# Patient Record
Sex: Female | Born: 1960 | Race: Black or African American | Hispanic: No | Marital: Married | Smoking: Never smoker
Health system: Southern US, Community
[De-identification: ages and names within clinical notes are randomized; demographics above are authoritative.]

## PROBLEM LIST (undated history)

## (undated) DIAGNOSIS — Z5189 Encounter for other specified aftercare: Secondary | ICD-10-CM

## (undated) DIAGNOSIS — D649 Anemia, unspecified: Secondary | ICD-10-CM

## (undated) DIAGNOSIS — E785 Hyperlipidemia, unspecified: Secondary | ICD-10-CM

## (undated) DIAGNOSIS — I10 Essential (primary) hypertension: Secondary | ICD-10-CM

## (undated) HISTORY — DX: Hyperlipidemia, unspecified: E78.5

## (undated) HISTORY — DX: Anemia, unspecified: D64.9

## (undated) HISTORY — PX: WISDOM TOOTH EXTRACTION: SHX21

## (undated) HISTORY — DX: Essential (primary) hypertension: I10

## (undated) HISTORY — PX: ABDOMINAL HYSTERECTOMY: SHX81

## (undated) HISTORY — DX: Encounter for other specified aftercare: Z51.89

---

## 1998-05-23 ENCOUNTER — Emergency Department (HOSPITAL_COMMUNITY): Admission: EM | Admit: 1998-05-23 | Discharge: 1998-05-23 | Payer: Self-pay | Admitting: Emergency Medicine

## 1999-05-07 ENCOUNTER — Emergency Department (HOSPITAL_COMMUNITY): Admission: EM | Admit: 1999-05-07 | Discharge: 1999-05-07 | Payer: Self-pay | Admitting: *Deleted

## 1999-05-09 ENCOUNTER — Emergency Department (HOSPITAL_COMMUNITY): Admission: EM | Admit: 1999-05-09 | Discharge: 1999-05-09 | Payer: Self-pay

## 2000-04-10 ENCOUNTER — Emergency Department (HOSPITAL_COMMUNITY): Admission: EM | Admit: 2000-04-10 | Discharge: 2000-04-10 | Payer: Self-pay | Admitting: Emergency Medicine

## 2000-06-06 ENCOUNTER — Encounter: Payer: Self-pay | Admitting: Emergency Medicine

## 2000-06-06 ENCOUNTER — Emergency Department (HOSPITAL_COMMUNITY): Admission: EM | Admit: 2000-06-06 | Discharge: 2000-06-06 | Payer: Self-pay | Admitting: Emergency Medicine

## 2004-11-04 ENCOUNTER — Other Ambulatory Visit: Admission: RE | Admit: 2004-11-04 | Discharge: 2004-11-04 | Payer: Self-pay | Admitting: Obstetrics and Gynecology

## 2005-05-26 ENCOUNTER — Emergency Department (HOSPITAL_COMMUNITY): Admission: EM | Admit: 2005-05-26 | Discharge: 2005-05-26 | Payer: Self-pay | Admitting: Emergency Medicine

## 2005-12-20 ENCOUNTER — Encounter: Admission: RE | Admit: 2005-12-20 | Discharge: 2005-12-20 | Payer: Self-pay | Admitting: Obstetrics and Gynecology

## 2006-01-02 ENCOUNTER — Observation Stay (HOSPITAL_COMMUNITY): Admission: AD | Admit: 2006-01-02 | Discharge: 2006-01-03 | Payer: Self-pay | Admitting: Obstetrics and Gynecology

## 2006-01-04 ENCOUNTER — Encounter (INDEPENDENT_AMBULATORY_CARE_PROVIDER_SITE_OTHER): Payer: Self-pay | Admitting: *Deleted

## 2006-01-04 ENCOUNTER — Inpatient Hospital Stay (HOSPITAL_COMMUNITY): Admission: AD | Admit: 2006-01-04 | Discharge: 2006-01-06 | Payer: Self-pay | Admitting: Obstetrics and Gynecology

## 2007-12-02 ENCOUNTER — Emergency Department (HOSPITAL_COMMUNITY): Admission: EM | Admit: 2007-12-02 | Discharge: 2007-12-02 | Payer: Self-pay | Admitting: Family Medicine

## 2007-12-20 ENCOUNTER — Ambulatory Visit: Payer: Self-pay | Admitting: Family Medicine

## 2008-02-28 ENCOUNTER — Ambulatory Visit: Payer: Self-pay | Admitting: Family Medicine

## 2008-03-04 ENCOUNTER — Ambulatory Visit: Payer: Self-pay | Admitting: Family Medicine

## 2008-04-10 ENCOUNTER — Ambulatory Visit: Payer: Self-pay | Admitting: Family Medicine

## 2010-02-17 ENCOUNTER — Emergency Department (HOSPITAL_COMMUNITY): Admission: EM | Admit: 2010-02-17 | Discharge: 2010-02-17 | Payer: Self-pay | Admitting: Emergency Medicine

## 2010-04-08 ENCOUNTER — Ambulatory Visit: Payer: Self-pay | Admitting: Family Medicine

## 2010-05-13 ENCOUNTER — Ambulatory Visit: Payer: Self-pay | Admitting: Family Medicine

## 2010-06-24 ENCOUNTER — Ambulatory Visit: Payer: Self-pay | Admitting: Family Medicine

## 2010-10-01 IMAGING — CR DG HIP COMPLETE 2+V*R*
3 series · 3 of 3 positions shown · non-contrast
Comparison: None

CLINICAL DATA: Pain

RIGHT HIP - COMPLETE 2+ VIEW

[t pelvis a.p.]
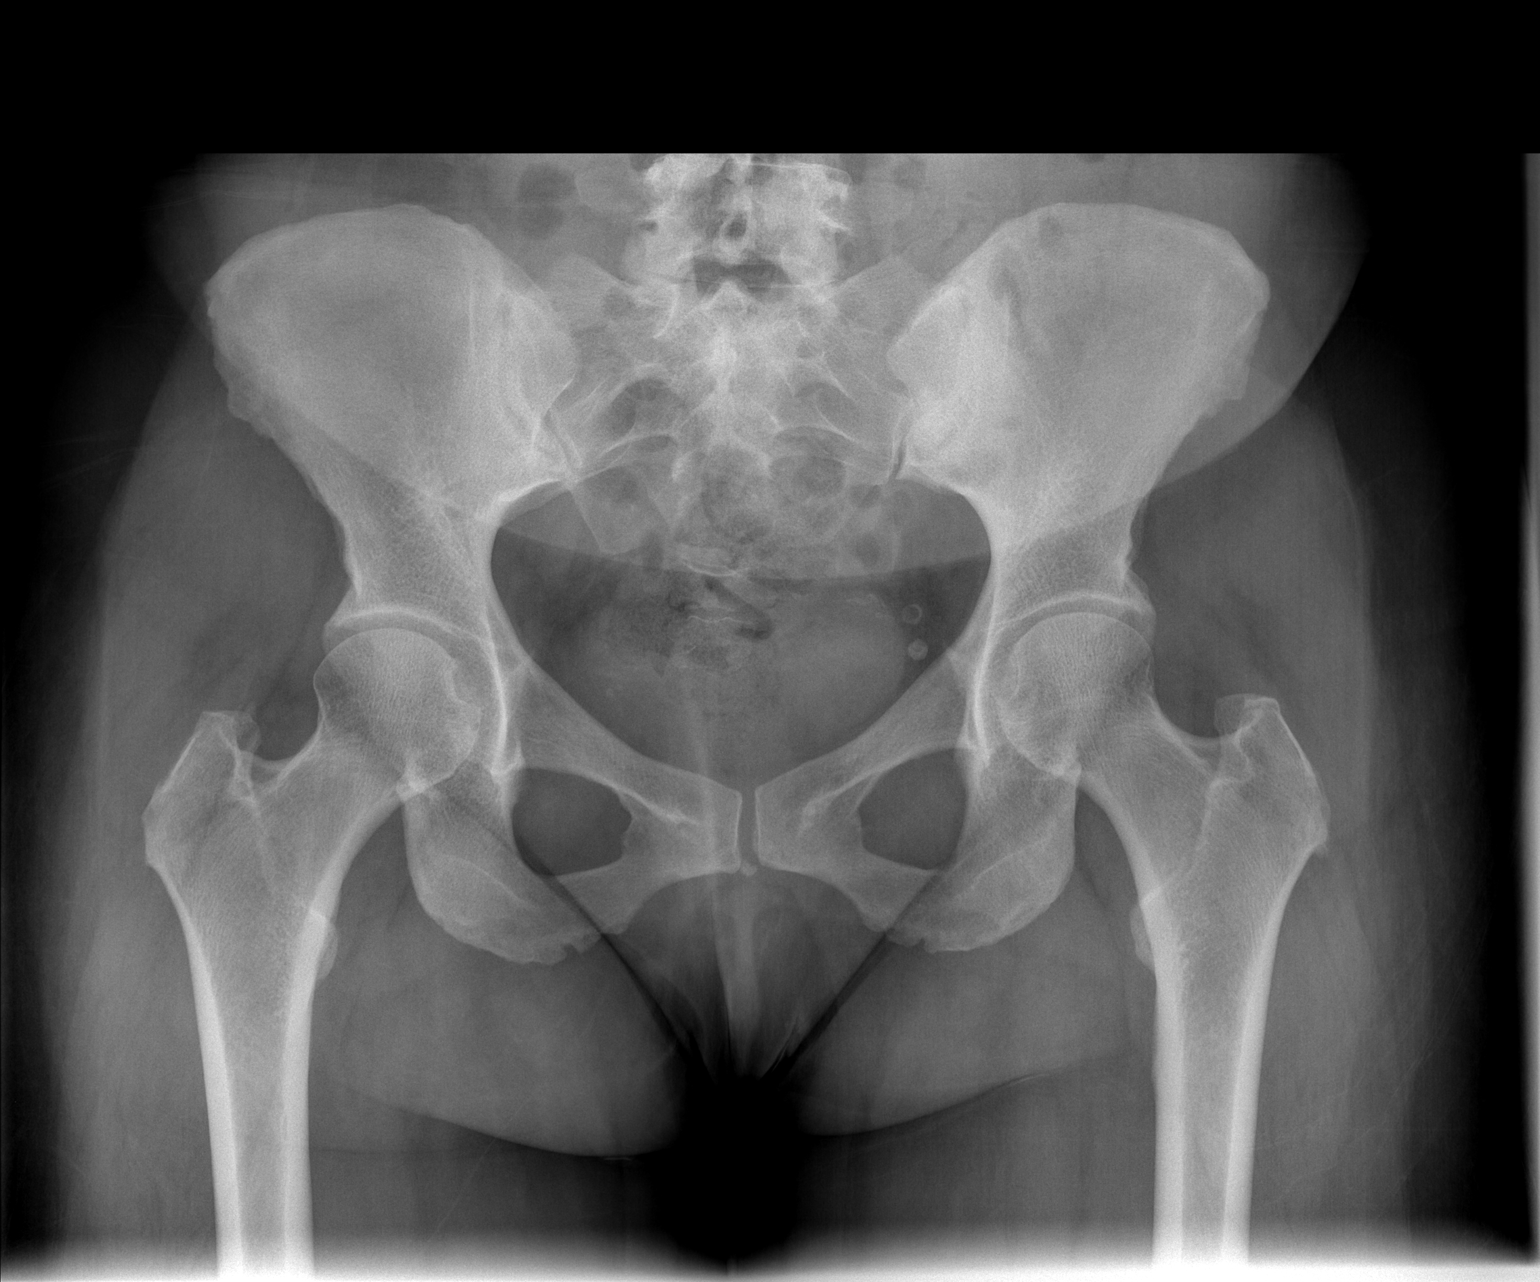

[t hip ap right]
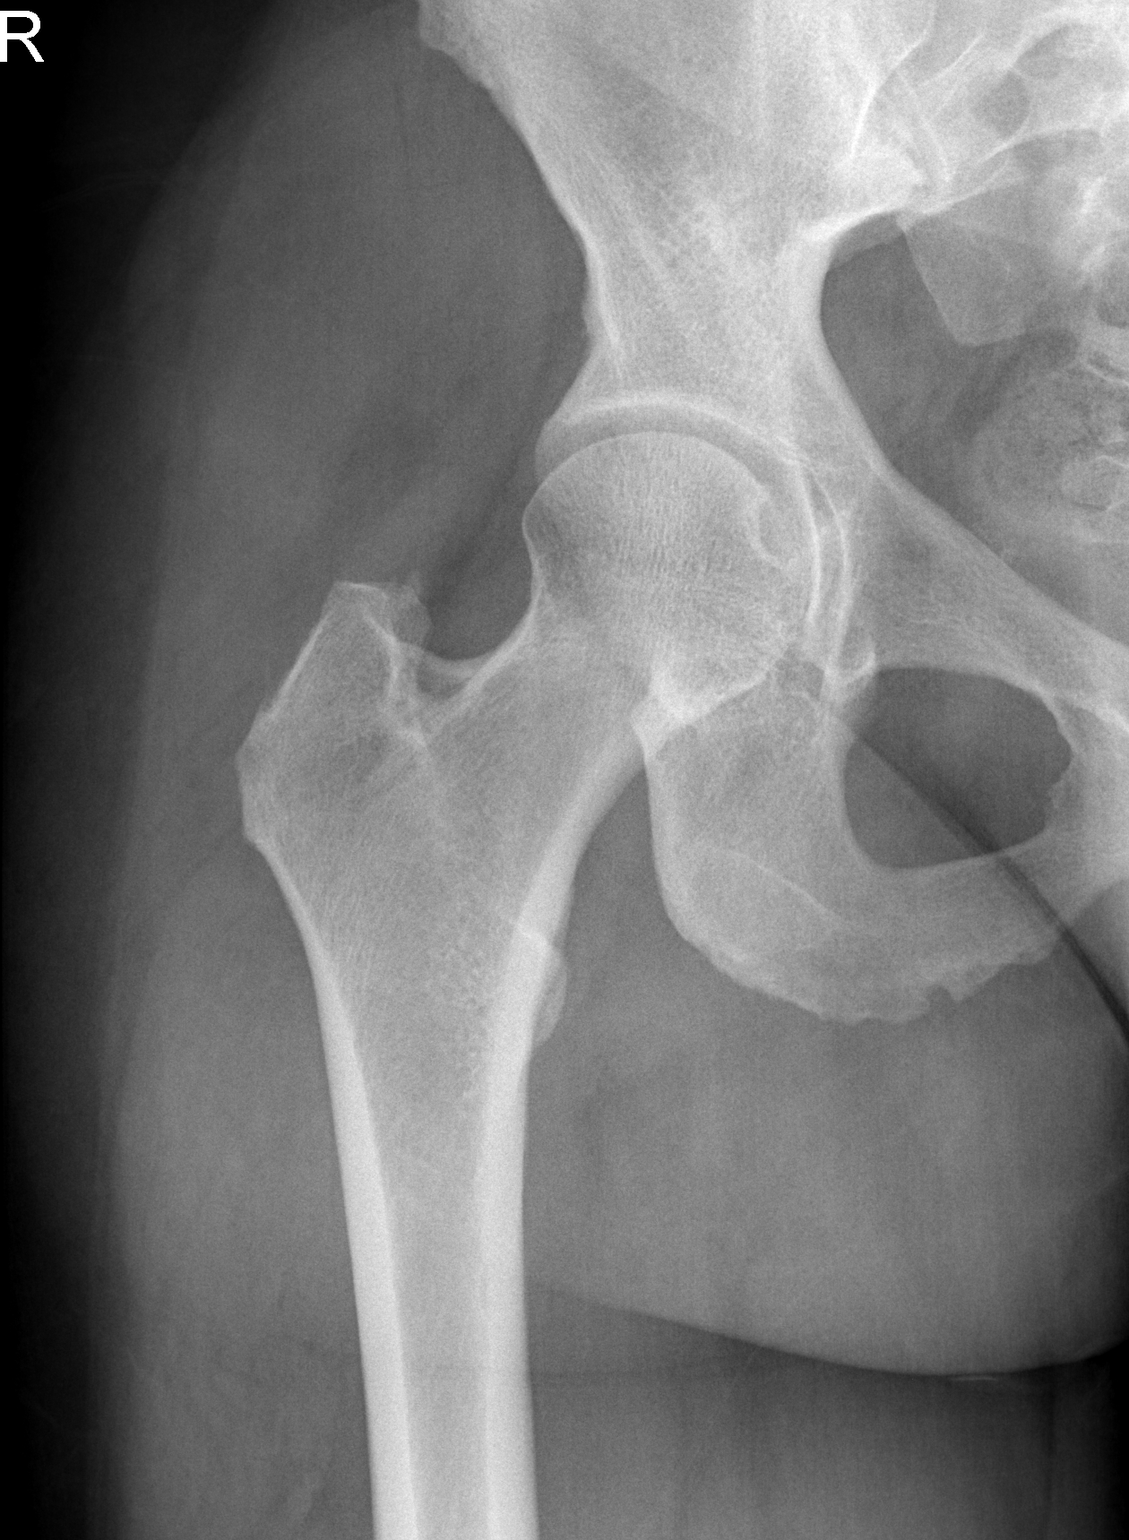

[t hip frog leg right]
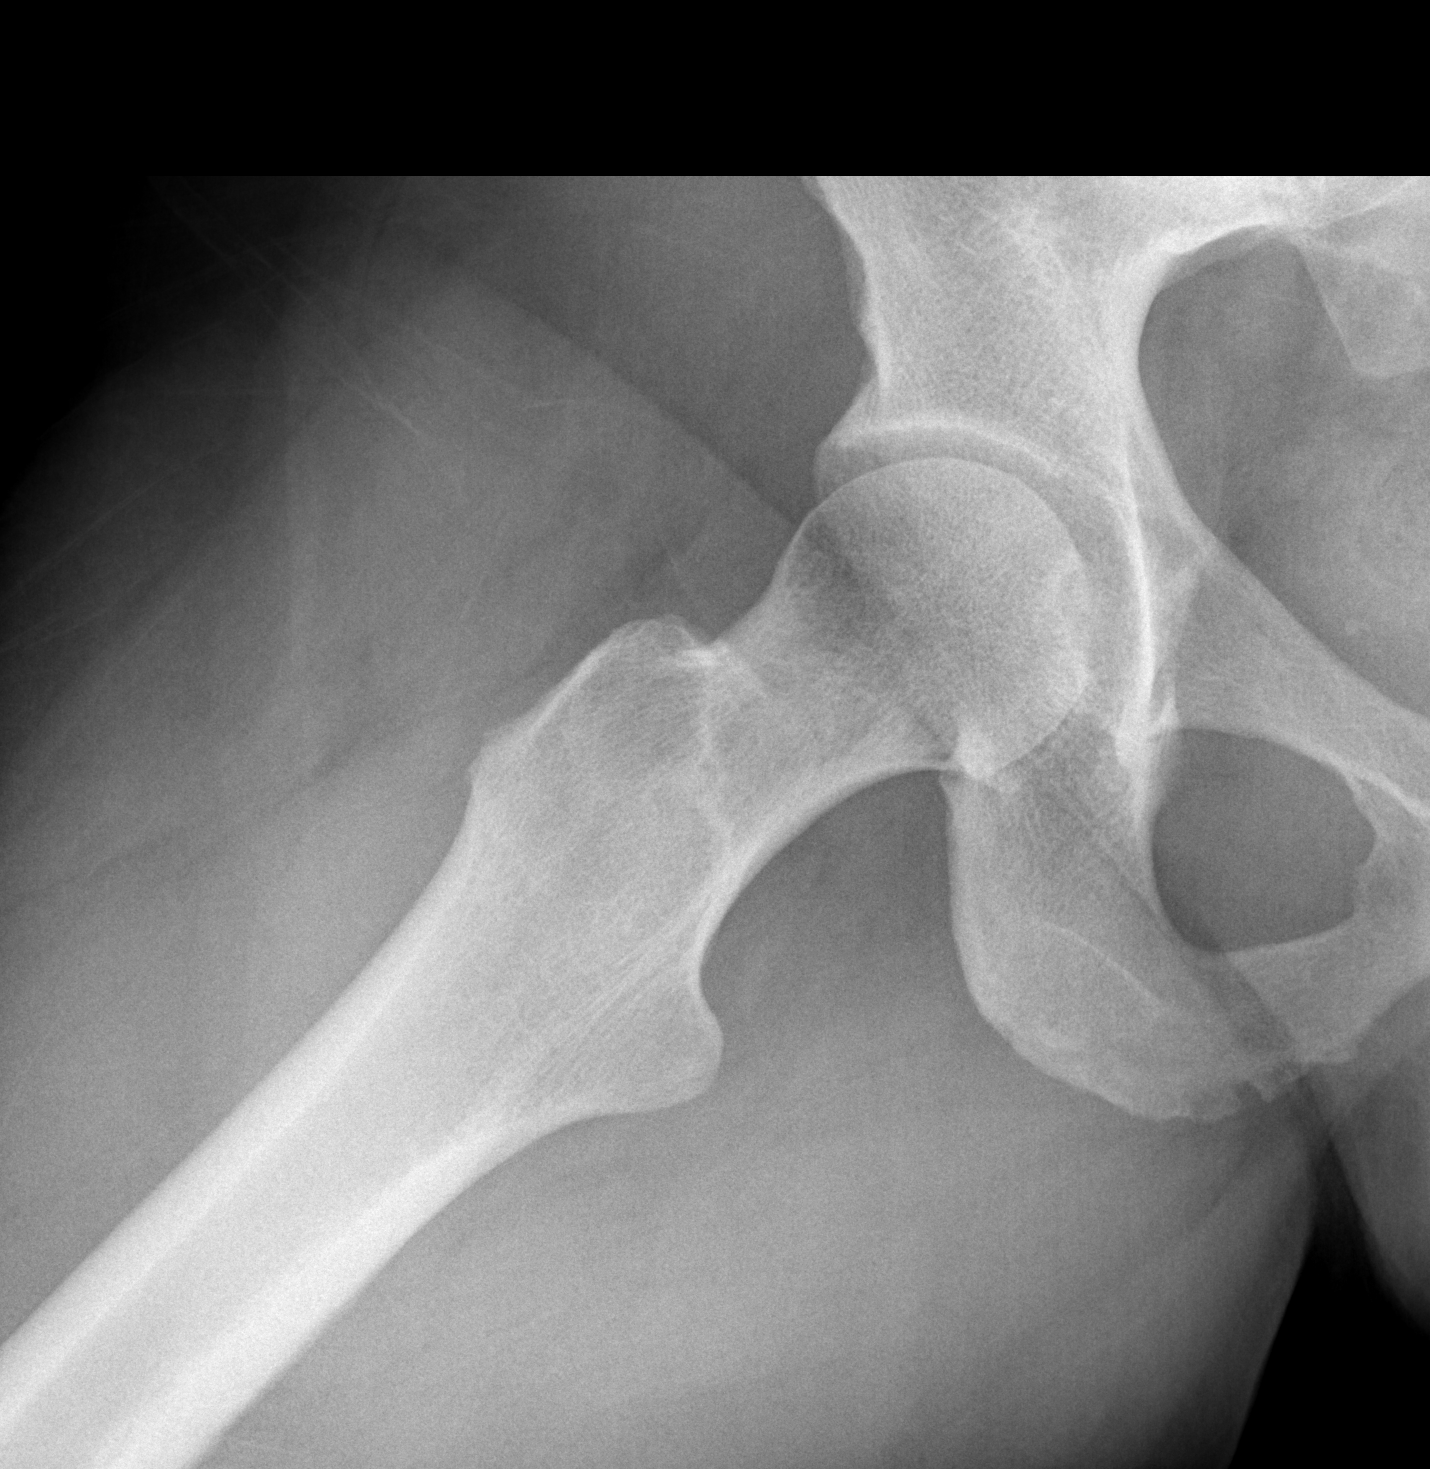

[3 of 3 positions shown; findings below may reference images not displayed]

FINDINGS: There is no evidence of fracture or dislocation.  There
is no evidence of arthropathy or other focal bone abnormality.
Soft tissues are unremarkable.
IMPRESSION: No acute findings.

## 2011-01-03 LAB — POCT I-STAT, CHEM 8
Calcium, Ion: 1.1 mmol/L — ABNORMAL LOW (ref 1.12–1.32)
Chloride: 104 mEq/L (ref 96–112)
Glucose, Bld: 95 mg/dL (ref 70–99)
HCT: 41 % (ref 36.0–46.0)
TCO2: 27 mmol/L (ref 0–100)

## 2011-01-03 LAB — URINE MICROSCOPIC-ADD ON

## 2011-01-03 LAB — URINALYSIS, ROUTINE W REFLEX MICROSCOPIC
Bilirubin Urine: NEGATIVE
Glucose, UA: NEGATIVE mg/dL
Ketones, ur: NEGATIVE mg/dL
Nitrite: POSITIVE — AB
Protein, ur: NEGATIVE mg/dL
Specific Gravity, Urine: 1.03 (ref 1.005–1.030)
Urobilinogen, UA: 1 mg/dL (ref 0.0–1.0)
pH: 6 (ref 5.0–8.0)

## 2011-03-03 NOTE — Op Note (Signed)
Jade Alvarez, Jade Alvarez             ACCOUNT NO.:  [id number]   MEDICAL RECORD NO.:  [id number]          PATIENT TYPE:  INP   LOCATION:  9308                          FACILITY:  [facility]   PHYSICIAN:  Janine Limbo, M.D.DATE OF BIRTH:  Nov 23, 1960   DATE OF PROCEDURE:  01/04/2006  DATE OF DISCHARGE:  01/06/2006                                 OPERATIVE REPORT   PREOPERATIVE DIAGNOSES:  1.  Fibroid uterus.  2.  Menorrhagia.  3.  Severe anemia.   POSTOPERATIVE DIAGNOSES:  1.  Fibroid uterus.  2.  Menorrhagia.  3.  Severe anemia.  4.  Pelvic and abdominal adhesions.   PROCEDURE:  1.  Total abdominal hysterectomy.  2.  Lysis of adhesions.   SURGEON:  Dr. Leonard Schwartz   FIRST ASSISTANT:  Elsie Ra, Certified Nurse Midwife.   ANESTHETIC:  General.   DISPOSITION:  Jade Alvarez is a 50 year old female, para 1-1-1-2, who  presents with menorrhagia and a known history of fibroids.  A hysterectomy  had been recommended previously but was declined.  The patient was admitted  to [facility] on January 02, 1006, after a fainting episode at home.  She was evaluated and was found to have a hemoglobin of 6.7.  The patient  was transfused 3 units of packed red blood cells, and she was also given  Depot Lupron.  Her posttransfusion hemoglobin was 8.3.  She was discharged  to home on January 03, 2006, feeling better.  However, on the morning of January 04, 2006, the patient once again fainted.  She noted that her bleeding had  also increased.  The patient was then evaluated once again at [facility] and her hemoglobin was noted to be 7.3.  The decision was made  this time to proceed with hysterectomy.  The patient was transfused 2 units  of packed red blood cells.  Her posttransfusion hemoglobin was 8.6.  The  patient understands the indications for her surgical procedure and she  accepts the risk of, but not limited to, anesthetic complications, bleeding,  infections, and  possible damage to the surrounding organs.  The patient  elects not to have her ovaries removed.   FINDINGS:  The patient was noted to have a 14-week size multifibroid uterus.  The fallopian tubes and the ovaries appeared normal, except there were  adhesions between the adnexa and the pelvic sidewalls.  There were adhesions  between the bowel and the posterior surface of the uterus.  There were  adhesions between the anterior uterus and the bladder (the patient has had 2  prior cesarean sections).  There was no evidence of endometriosis, however,  in the pelvis.  The appendix and the bowel appeared normal.  Sterile milk  was placed in the bladder, and the bladder was noted to be intact.  The  ureters were identified and were noted to be normal.  There was no evidence  of damage to the ureters.  The patient was also noted to have dense scarring  through the layers of the abdominal wall.   PROCEDURE:  The patient was taken to  the operating room where a general  anesthetic was given.  The patient's abdomen, perineum, and vagina were  prepped with multiple layers of Betadine.  A Foley catheter was placed in  the bladder.  The patient was then sterilely draped.  The lower abdomen was  injected with 20 mL of 0.5% Marcaine with epinephrine.  A low transverse  incision was made through the previous incision.  The incision was extended  through the subcutaneous tissue, the fascia, and the anterior peritoneum.  Dense scarring was noted at each of the layers.  Care was taken not to  damage the bowel as we entered the abdominal cavity.  An abdominal wall  retractor was placed.  The uterus was elevated into our operative field.  Lysis of adhesions was accomplished using a combination of sharp and blunt  dissection.  There was some bleeding noted from the uterine surface, and  hemostasis was achieved using figure-of-eight sutures on the uterus.  We  identified the round ligaments bilaterally.  The  right round ligament was  then clamped, cut, sutured, and tied securely.  An identical procedure was  carried out on the opposite side.  The bladder flap was developed  anteriorly.  The patient was noted to have some bleeding from the posterior  surface of the bladder.  Hemostasis was achieved using free ties.  The right  upper pedicle was then isolated, clamped, cut, free tied, and then suture  ligated.  An identical procedure was carried out on the opposite side.  The  uterine vessels were skeletonized bilaterally.  Alternating from right to  left, the uterine arteries and parametrial tissues were then clamped, cut,  sutured, and tied securely.  We transected the fundus of the uterus from the  cervix to aid in visualization.  We were then better able to develop the  bladder flap.  The paracervical tissues, uterosacral ligaments, and vaginal  angles were then clamped, cut, sutured, and tied securely.  The cervix was  transected from the apex of the vagina.  The apex of the vagina was closed  using figure-of-eight sutures.  We again noted bleeding from the bladder  flap, and figure-of-eight sutures were placed at this time, taking care not  to enter the mucosa of the bladder.  At this point, hemostasis was noted to  be adequate.  The pelvis was vigorously irrigated.  The sutures attached to  the uterosacral ligaments were then tied to the vaginal angles.  The  estimated blood loss at this point was noted to be 600 mL, and therefore the  patient was transfused an additional unit of packed red blood cells.  A  final check was made for hemostasis, and hemostasis was confirmed  throughout.  The packs were removed.  The anterior peritoneum was closed  using a running suture of 3-0 Vicryl.  The abdominal musculature was  reapproximated in the midline.  The abdominal musculature, fascia, and the  subcutaneous layer were irrigated.  The fascia was closed using 2 running sutures of 0 PDS from the  corners to the midline.  The subcutaneous tissue  was undermined using the Bovie cautery to aid in closing the incision.  Dense scarring was again noted here.  After the scar tissue was released, we  closed the subcutaneous layer using a running suture of 0 Vicryl.  The skin  was reapproximated using a subcuticular suture of 3-0 Monocryl.  Sponge,  needle, and instrument counts were correct on 2 occasions.  The estimated  blood loss for the procedure was 600 mL.  The patient tolerated her  procedure well.  The patient's urine was noted to be clear except for a  cloudiness from the sterile milk that was placed.  The patient was given 30  mg of Toradol IV and 30 mg of Toradol IM during the operative procedure.  Zero Vicryl was the suture material used except where otherwise mentioned.  The patient was awakened from her anesthetic without difficulty and taken to  the recovery room in stable condition.  She tolerated her procedure well.  The uterus was noted to weigh greater than 600 grams, and it was sent to  pathology.  Adhesions were sent to pathology for evaluation as well.      Janine Limbo, M.D.  Electronically Signed     AVS/MEDQ  D:  01/04/2006  T:  01/06/2006  Job:  [id number]

## 2011-03-03 NOTE — H&P (Signed)
NAME:  Jade Alvarez, DUPLECHAIN NO.:  [id number]   MEDICAL RECORD NO.:  [id number]          PATIENT TYPE:  MAT   LOCATION:  MATC                          FACILITY:  [facility]   PHYSICIAN:  Osborn Coho, M.D.   DATE OF BIRTH:  18-Sep-1961   DATE OF ADMISSION:  01/02/2006  DATE OF DISCHARGE:                                HISTORY & PHYSICAL   HISTORY OF PRESENT ILLNESS:  Ms. Jade Alvarez is a 50 year old GYN patient.  She  is a gravida 3, para 1-1-1-2, with a history of 2 live births, C-sections  x2, for failure to progress. She presents today with a history of fibroid  tumors, irregular cycles with very heavy bleeding. She has been followed  since August of 2006 when she was diagnosed by Dr. Marline Backbone.  She is  currently taking iron supplements and multivitamins.  Her cycles have been  irregular and she reports 2 cycles in February.  Her current cycle started  on Monday, January 01, 2006, with very heavy bleeding and large clots. The  patient reports soaking greater than 1 pad per hour, and she did pass out at  home yesterday.  Her bleeding today was worse than it was yesterday.  Her  bleeding was heavier with more clots. She did pass out at home and woke up  on the floor.  She called into the office of [facility] and was instructed to come  to maternity admissions at [facility] for evaluation. The patient is  symptomatic with her anemia and passed out when standing for orthostatic  vital signs.  She is therefore to be admitted for blood transfusion.   MEDICAL HISTORY:  History of fibroids and anemia.  Otherwise, no medical  conditions.   ALLERGIES:  The patient has a PENICILLIN allergy.   SOCIAL HISTORY:  She denies the use of tobacco, alcohol or illicit drugs.   OBSTETRIC HISTORY:  C-section x2 for 2 living children and a first trimester  SAB with no complications.   FAMILY HISTORY:  Mother has a history of anemia.  Maternal grandmother with  breast cancer.  Paternal  grandfather and grandmother with heart disease and  diabetes.   SOCIAL HISTORY:  Jade Alvarez is a married African-American female. 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  Her husband, Jade Alvarez, is involved and supportive. He works at [employer].   PHYSICAL EXAMINATION:  VITAL SIGNS:  The patient is afebrile. Temperature  97.5, pulse 97 lying, and blood pressure 87/60 lying, pulse 98 sitting and  blood pressure 94/53 sitting, pulse 106 standing, blood pressure 71/42  standing. The patient did pass out when standing for blood pressure. Her  respirations are 20.  HEENT:  Unremarkable. HEART: Regular rate and rhythm.  LUNGS:  Clear. ABDOMEN:  Soft and nontender.  Negative CVA tenderness  bilaterally. EXTREMITIES:  No pathologic edema.  There is no calf tenderness  bilaterally.  PELVIC:  Normal external genitalia. Vagina with moderate blood  in the vault. No active bleeding at present.  Her cervix is long and closed  with no cervical motion tenderness. Uterus  fibroids were noted 16 weeks'  size, mobile, nontender. Adnexa no masses.   LABORATORY DATA:  CBC revealed a WBC of 10.9, RBC 2.53, hemoglobin 6.7,  hematocrit 22.2, platelets 216,000.   ASSESSMENT:  Dysfunctional uterine bleeding with uterine fibroids,  symptomatic anemia.   PLAN:  Admit per Dr. Osborn Coho.  The patient is to receive blood  transfusion to hematocrit of 20. This was discussed, and the patient has  consented.  See orders as written.      Rica Koyanagi, C.N.M.      Osborn Coho, M.D.  Electronically Signed    SDM/MEDQ  D:  01/02/2006  T:  01/03/2006  Job:  [id number]

## 2011-03-03 NOTE — Discharge Summary (Signed)
Jade Alvarez, Jade Alvarez             ACCOUNT NO.:  [id number]   MEDICAL RECORD NO.:  [id number]          PATIENT TYPE:  INP   LOCATION:  9308                          [facility]ACILITY:  [facility]   PHYSICIAN:  Janine Limbo, M.D.DATE OF BIRTH:  [birth year]   DATE OF ADMISSION:  01/04/2006  DATE OF DISCHARGE:  01/06/2006                                 DISCHARGE SUMMARY   DISCHARGE DIAGNOSES:  1.  Symptomatic uterine fibroids.  2.  Adenomyosis.  3.  Menorrhagia.  4.  Pelvic adhesions.  5.  Severe anemia (hemoglobin as low as 6.7).   OPERATION ON THE DATE OF ADMISSION:  The patient underwent a total abdominal  hysterectomy with lysis of adhesions, tolerating the procedure well.   HISTORY OF PRESENT ILLNESS:  Jade Alvarez is a 50 year old female para 1-1-1-  2 who presents with a history of severe anemia secondary to menorrhagia (had  been transfused 3 units of packed red blood cells 3 days prior to  admission).  The patient on the day of admission was noted to have a  hemoglobin of 7.3, and, in spite of having been given Lupron, had resumed  heavy bleeding and was admitted for further management.  Please see the  patient's dictated history and physical examination for details.   ADMISSION PHYSICAL EXAMINATION:  VITAL SIGNS:  The patient was afebrile and  vital signs were stable on admission.  GENERAL:  Within normal limits.  PELVIC:  An 18-week size fibroid uterus.   HOSPITAL COURSE:  On the day of admission, the patient was transfused 2  units of packed red blood cells which brought her hemoglobin to 7.3.  After  this, the patient consented to proceed with a total abdominal hysterectomy  for definitive treatment of her symptoms.  Hospital course was unremarkable  with the patient achieving a post transfusion hemoglobin of 8.9 and  tolerating a postoperative hemoglobin of 6.9.  By postoperative day #2, the  patient had resumed bowel and bladder function, was stable and not  orthostatic,  and was therefore deemed ready for discharge home.   DISCHARGE MEDICATIONS:  1.  Vicodin 1-2 tablets every 4 hours as needed for pain.  2.  Ibuprofen 200 mg 3 tablets every 6 hours as needed for mild to moderate      pain.  3.  Iron 325 mg 1 tablet three times daily for 6 weeks.  4.  One-a-Day vitamin 1 tablet daily.   FOLLOW UP:  The patient was asked to call for a 6 weeks postoperative visit  with Dr. Stefano Gaul at [phone].   DISCHARGE INSTRUCTIONS:  The patient was given a copy of [facility] postoperative instruction sheet.  She was further advised to avoid  driving for 2 weeks, heavy lifting for 4 weeks, intercourse for 6 weeks, to  increase her activities slowly, that she may shower, that she may walk up  steps.   DIET:  The patient's diet was without restriction.   FINAL PATHOLOGY:  1.  Uterus:  Endometrium revealed proliferative endometrium without      hyperplasia or malignancy identified; myometrium revealed adenomyosis  with multiple leiomyomas.  2.  Pelvic adhesions:  Dense fibrous tissue consistent with adhesions.      Elmira J. Adline Peals.      Janine Limbo, M.D.  Electronically Signed    EJP/MEDQ  D:  01/25/2006  T:  01/25/2006  Job:  [id number]

## 2011-03-03 NOTE — H&P (Signed)
NAMEMARKEETA, Jade Alvarez             ACCOUNT NO.:  [id number]   MEDICAL RECORD NO.:  [id number]          PATIENT TYPE:  INP   LOCATION:  9308                          [facility]   PHYSICIAN:  Janine Limbo, M.D.DATE OF BIRTH:  1961/07/29   DATE OF ADMISSION:  01/04/2006  DATE OF DISCHARGE:                                HISTORY & PHYSICAL   HISTORY OF PRESENT ILLNESS:  Jade Alvarez is a 50 year old female, para 1-1-  1-2, who is admitted because of menorrhagia and severe anemia. The patient  has a known history of fibroids. The patient was at to the hospital on January 02, 2006 after having a syncopal episode. Her hemoglobin was noted to be  6.7. The patient was transfused 3 units of packed red blood cells. She did  feel better. Her post-transfusion hemoglobin was 8.3. The patient was  discharged to home on January 03, 2006. The patient was also given Depo-Lupron  and was encouraged to consider surgical management. However on January 04, 2006 the patient again had a syncopal episode and returned after having even  more vaginal bleeding. The patient has had two cesarean sections in the  past.   OBSTETRICAL HISTORY:  The patient has had two cesarean sections in the past  as mentioned above, and she has also had one first trimester miscarriage.   PAST MEDICAL HISTORY:  The patient denies hypertension and diabetes.   CURRENT MEDICATIONS:  Include iron and vitamins.   ALLERGIES:  PENICILLIN.   SOCIAL HISTORY:  The patient denies alcohol use, tobacco use, and  recreational drug use.   REVIEW OF SYSTEMS:  Please see history of present illness.   FAMILY HISTORY:  Noncontributory.   PHYSICAL EXAMINATION:  The patient was afebrile and vital signs were stable  on admission.  HEENT:  Within normal limits.  CHEST:  Clear.  HEART:  Regular rate and rhythm.  BREASTS:  Without masses.  ABDOMEN:  Soft and a firm mass could be palpated in the pelvis.  EXTREMITIES:  Grossly normal.  NEUROLOGIC EXAM:  Grossly normal.  PELVIC EXAM:  Showed an 18-week sized fibroid uterus.   LABORATORY VALUES:  Hemoglobin on admission today was 7.3. The patient had a  benign endometrial biopsy in 2006. Her Pap smear was within normal limits in  2006.   ASSESSMENT:  1.  An 18-week sized fibroid uterus.  2.  Severe anemia.  3.  Menorrhagia.   PLAN:  We will transfuse the patient two additional units of packed red  blood cells. I have recommended that we proceed with abdominal hysterectomy  and the patient agrees. The patient understands the indications for her  surgical procedure and she accepts the risk of, but not limited to,  anesthetic complications, bleeding, infections, and possible damage to the  surrounding organs.      Janine Limbo, M.D.  Electronically Signed     AVS/MEDQ  D:  01/04/2006  T:  01/05/2006  Job:  [id number]

## 2011-07-07 LAB — I-STAT 8, (EC8 V) (CONVERTED LAB)
BUN: 14
Bicarbonate: 26.4 — ABNORMAL HIGH
Glucose, Bld: 89
Potassium: 4
Sodium: 136
TCO2: 28
pH, Ven: 7.37 — ABNORMAL HIGH

## 2011-07-07 LAB — POCT I-STAT CREATININE: Operator id: 235561

## 2011-12-06 ENCOUNTER — Ambulatory Visit (INDEPENDENT_AMBULATORY_CARE_PROVIDER_SITE_OTHER): Payer: Self-pay | Admitting: Family Medicine

## 2011-12-06 VITALS — BP 190/120 | HR 78

## 2011-12-06 DIAGNOSIS — R040 Epistaxis: Secondary | ICD-10-CM

## 2011-12-06 DIAGNOSIS — I10 Essential (primary) hypertension: Secondary | ICD-10-CM

## 2011-12-06 MED ORDER — LISINOPRIL-HYDROCHLOROTHIAZIDE 20-12.5 MG PO TABS: 1.0000 | ORAL_TABLET | Freq: Every day | ORAL | Status: DC

## 2011-12-06 MED ORDER — CARVEDILOL 6.25 MG PO TABS: 6.2500 mg | ORAL_TABLET | Freq: Two times a day (BID) | ORAL | Status: DC

## 2011-12-06 NOTE — Progress Notes (Signed)
  Subjective:    Patient ID: Jade Alvarez, female    DOB: Sep 28, 1961, 51 y.o.   MRN: [id number]  HPI She developed some bleeding from the right nostril earlier today. She has been off her blood pressure medications for approximately 2 months.   Review of Systems     Objective:   Physical Exam Active bleeding noted from the right nostril.       Assessment & Plan:   1. Epistaxis  Ambulatory referral to ENT  2. Hypertension     the nose was cleaned and packed with gauze impregnated with oxymetazoline. The anterior nasal septum appeared clear. Blood seems to be coming from the posterior part of the nose. She will be referred to ENT. I'll also renew her blood pressure medication and recheck here in one month.

## 2011-12-29 ENCOUNTER — Encounter: Payer: Self-pay | Admitting: Internal Medicine

## 2012-01-03 ENCOUNTER — Ambulatory Visit (INDEPENDENT_AMBULATORY_CARE_PROVIDER_SITE_OTHER): Payer: Self-pay | Admitting: Family Medicine

## 2012-01-03 ENCOUNTER — Encounter: Payer: Self-pay | Admitting: Family Medicine

## 2012-01-03 VITALS — BP 142/90 | HR 68 | Wt 150.0 lb

## 2012-01-03 DIAGNOSIS — I152 Hypertension secondary to endocrine disorders: Secondary | ICD-10-CM | POA: Insufficient documentation

## 2012-01-03 DIAGNOSIS — I10 Essential (primary) hypertension: Secondary | ICD-10-CM

## 2012-01-03 MED ORDER — CARVEDILOL 12.5 MG PO TABS: 12.5000 mg | ORAL_TABLET | Freq: Two times a day (BID) | ORAL | Status: DC

## 2012-01-03 NOTE — Patient Instructions (Signed)
Take 2 pills twice a day of your present prescription and then one a twice a day of the new one and I will see you in one month

## 2012-01-03 NOTE — Progress Notes (Signed)
  Subjective:    Patient ID: Jade Alvarez, female    DOB: [birth year], 51 y.o.   MRN: [id number]  HPI She is here for a blood pressure recheck. Since her last visit, the nosebleeds have been taken care of. She continues on medications listed in the chart and is having no difficulty with them.   Review of Systems     Objective:   Physical Exam Alert and in no distress. Blood pressure is recorded. I rechecked it and it was 150/95       Assessment & Plan:   1. Hypertension    will increase her Coreg to 12.5 twice a day and recheck her in one  month

## 2012-02-02 ENCOUNTER — Ambulatory Visit (INDEPENDENT_AMBULATORY_CARE_PROVIDER_SITE_OTHER): Payer: Self-pay | Admitting: Family Medicine

## 2012-02-02 ENCOUNTER — Encounter: Payer: Self-pay | Admitting: Family Medicine

## 2012-02-02 VITALS — BP 145/84 | HR 60 | Wt 153.0 lb

## 2012-02-02 DIAGNOSIS — Z79899 Other long term (current) drug therapy: Secondary | ICD-10-CM

## 2012-02-02 DIAGNOSIS — I10 Essential (primary) hypertension: Secondary | ICD-10-CM

## 2012-02-02 NOTE — Progress Notes (Signed)
  Subjective:    Patient ID: Jade Alvarez, female    DOB: 1960-12-31, 51 y.o.   MRN: [id number]  HPI He is here for recheck. She continues on her blood pressure medications and is having no difficulty with them.   Review of Systems     Objective:   Physical Exam Alert and in no distress. Blood pressure is recorded      Assessment & Plan:   1. Hypertension   2. Encounter for long-term (current) use of other medications    she will continue present medications. Strongly encouraged her to get involved in a regular exercise program.

## 2012-12-17 ENCOUNTER — Telehealth: Payer: Self-pay | Admitting: Family Medicine

## 2012-12-17 DIAGNOSIS — I10 Essential (primary) hypertension: Secondary | ICD-10-CM

## 2012-12-17 MED ORDER — CARVEDILOL 12.5 MG PO TABS: 12.5000 mg | ORAL_TABLET | Freq: Two times a day (BID) | ORAL | Status: DC

## 2012-12-17 MED ORDER — LISINOPRIL-HYDROCHLOROTHIAZIDE 20-12.5 MG PO TABS: 1.0000 | ORAL_TABLET | Freq: Every day | ORAL | Status: DC

## 2012-12-17 NOTE — Telephone Encounter (Signed)
Sent med in 

## 2012-12-24 ENCOUNTER — Ambulatory Visit (INDEPENDENT_AMBULATORY_CARE_PROVIDER_SITE_OTHER): Payer: Self-pay | Admitting: Family Medicine

## 2012-12-24 ENCOUNTER — Encounter: Payer: Self-pay | Admitting: Family Medicine

## 2012-12-24 VITALS — BP 130/80 | HR 78 | Ht 62.0 in | Wt 162.0 lb

## 2012-12-24 DIAGNOSIS — Z79899 Other long term (current) drug therapy: Secondary | ICD-10-CM

## 2012-12-24 DIAGNOSIS — I10 Essential (primary) hypertension: Secondary | ICD-10-CM

## 2012-12-24 LAB — CBC WITH DIFFERENTIAL/PLATELET
Eosinophils Relative: 2 % (ref 0–5)
HCT: 37.9 % (ref 36.0–46.0)
Hemoglobin: 12.8 g/dL (ref 12.0–15.0)
Lymphocytes Relative: 31 % (ref 12–46)
Lymphs Abs: 2 10*3/uL (ref 0.7–4.0)
MCV: 88.3 fL (ref 78.0–100.0)
Monocytes Absolute: 0.6 10*3/uL (ref 0.1–1.0)
Monocytes Relative: 9 % (ref 3–12)
Neutro Abs: 3.8 10*3/uL (ref 1.7–7.7)
WBC: 6.5 10*3/uL (ref 4.0–10.5)

## 2012-12-24 MED ORDER — CARVEDILOL 12.5 MG PO TABS: 12.5000 mg | ORAL_TABLET | Freq: Two times a day (BID) | ORAL | Status: DC

## 2012-12-24 MED ORDER — LISINOPRIL-HYDROCHLOROTHIAZIDE 20-12.5 MG PO TABS: 1.0000 | ORAL_TABLET | Freq: Every day | ORAL | Status: DC

## 2012-12-24 NOTE — Progress Notes (Signed)
  Subjective:    Patient ID: Jade Alvarez, female    DOB: July 05, 1961, 52 y.o.   MRN: [id number]  HPI She is here for followup visit. She does not have insurance but is working to get it. She continues on her blood pressure medications and is having no difficulty with them. She offers no other concerns or complaints.   Review of Systems     Objective:   Physical Exam alert and in no distress. Tympanic membranes and canals are normal. Throat is clear. Tonsils are normal. Neck is supple without adenopathy or thyromegaly. Cardiac exam shows a regular sinus rhythm without murmurs or gallops. Lungs are clear to auscultation.        Assessment & Plan:  Hypertension - Plan: CBC with Differential, Comprehensive metabolic panel, carvedilol (COREG) 12.5 MG tablet, lisinopril-hydrochlorothiazide (PRINZIDE,ZESTORETIC) 20-12.5 MG per tablet  Encounter for long-term (current) use of other medications - Plan: CBC with Differential, RPR, Comprehensive metabolic panel, TSH, Lipid panel her medications were renewed. Recommend she come in for complete exam sometime in the near future.

## 2012-12-25 LAB — TSH: TSH: 1.409 u[IU]/mL (ref 0.350–4.500)

## 2012-12-25 LAB — COMPREHENSIVE METABOLIC PANEL
Albumin: 4.1 g/dL (ref 3.5–5.2)
Alkaline Phosphatase: 47 U/L (ref 39–117)
BUN: 11 mg/dL (ref 6–23)
CO2: 28 mEq/L (ref 19–32)
Calcium: 9.3 mg/dL (ref 8.4–10.5)
Chloride: 101 mEq/L (ref 96–112)
Glucose, Bld: 79 mg/dL (ref 70–99)
Potassium: 3.7 mEq/L (ref 3.5–5.3)
Sodium: 137 mEq/L (ref 135–145)
Total Protein: 6.9 g/dL (ref 6.0–8.3)

## 2012-12-25 LAB — LIPID PANEL
Cholesterol: 209 mg/dL — ABNORMAL HIGH (ref 0–200)
VLDL: 29 mg/dL (ref 0–40)

## 2014-02-02 ENCOUNTER — Telehealth: Payer: Self-pay | Admitting: Family Medicine

## 2014-02-02 ENCOUNTER — Other Ambulatory Visit: Payer: Self-pay

## 2014-02-02 DIAGNOSIS — I10 Essential (primary) hypertension: Secondary | ICD-10-CM

## 2014-02-02 MED ORDER — CARVEDILOL 12.5 MG PO TABS: 12.5000 mg | ORAL_TABLET | Freq: Two times a day (BID) | ORAL | Status: DC

## 2014-02-02 NOTE — Telephone Encounter (Signed)
[facility] pharmacy requesting rf on carvedilol 12.5mg . Patient has no upcoming appt.

## 2014-02-02 NOTE — Telephone Encounter (Signed)
Medication sent in. 

## 2014-02-12 ENCOUNTER — Telehealth: Payer: Self-pay

## 2014-02-12 NOTE — Telephone Encounter (Signed)
Refill request for LISINOP-HCTZ 20-15.5 #30

## 2014-02-12 NOTE — Telephone Encounter (Signed)
Called pt to let her know she needs an appt before i can send in a refill for a 30 day supply

## 2019-09-01 ENCOUNTER — Other Ambulatory Visit: Payer: Self-pay

## 2019-09-01 ENCOUNTER — Emergency Department (HOSPITAL_COMMUNITY): Payer: 59

## 2019-09-01 ENCOUNTER — Observation Stay (HOSPITAL_COMMUNITY)
Admission: EM | Admit: 2019-09-01 | Discharge: 2019-09-03 | Disposition: A | Discharge status: Home / Self Care | Payer: 59 | Attending: Internal Medicine | Admitting: Internal Medicine

## 2019-09-01 DIAGNOSIS — Z20828 Contact with and (suspected) exposure to other viral communicable diseases: Secondary | ICD-10-CM | POA: Diagnosis not present

## 2019-09-01 DIAGNOSIS — I10 Essential (primary) hypertension: Secondary | ICD-10-CM | POA: Diagnosis not present

## 2019-09-01 DIAGNOSIS — H1031 Unspecified acute conjunctivitis, right eye: Secondary | ICD-10-CM | POA: Diagnosis not present

## 2019-09-01 DIAGNOSIS — Z79899 Other long term (current) drug therapy: Secondary | ICD-10-CM | POA: Insufficient documentation

## 2019-09-01 DIAGNOSIS — Z880 Allergy status to penicillin: Secondary | ICD-10-CM | POA: Insufficient documentation

## 2019-09-01 DIAGNOSIS — I160 Hypertensive urgency: Secondary | ICD-10-CM

## 2019-09-01 DIAGNOSIS — I169 Hypertensive crisis, unspecified: Principal | ICD-10-CM | POA: Diagnosis present

## 2019-09-01 LAB — CBC
HCT: 42.7 % (ref 36.0–46.0)
Hemoglobin: 13.5 g/dL (ref 12.0–15.0)
MCH: 29.2 pg (ref 26.0–34.0)
MCHC: 31.6 g/dL (ref 30.0–36.0)
MCV: 92.2 fL (ref 80.0–100.0)
Platelets: 328 10*3/uL (ref 150–400)
RBC: 4.63 MIL/uL (ref 3.87–5.11)
RDW: 13.3 % (ref 11.5–15.5)
WBC: 8.5 10*3/uL (ref 4.0–10.5)
nRBC: 0 % (ref 0.0–0.2)

## 2019-09-01 LAB — BASIC METABOLIC PANEL
Anion gap: 13 (ref 5–15)
BUN: 11 mg/dL (ref 6–20)
CO2: 23 mmol/L (ref 22–32)
Calcium: 9.1 mg/dL (ref 8.9–10.3)
Chloride: 103 mmol/L (ref 98–111)
Creatinine, Ser: 0.98 mg/dL (ref 0.44–1.00)
GFR calc Af Amer: >60 mL/min (ref >60)
GFR calc non Af Amer: >60 mL/min (ref >60)
Glucose, Bld: 103 mg/dL — ABNORMAL HIGH (ref 70–99)
Potassium: 3.6 mmol/L (ref 3.5–5.1)
Sodium: 139 mmol/L (ref 135–145)

## 2019-09-01 LAB — TROPONIN I (HIGH SENSITIVITY): Troponin I (High Sensitivity): 91 ng/L — ABNORMAL HIGH (ref <18)

## 2019-09-01 MED ORDER — LISINOPRIL 20 MG PO TABS
20.0000 mg | ORAL_TABLET | Freq: Once | ORAL | Status: AC
Start: 2019-09-01 — End: 2019-09-01
  Administered 2019-09-01: 20:00:00 20 mg via ORAL
  Filled 2019-09-01: qty 1

## 2019-09-01 MED ORDER — HEPARIN SODIUM (PORCINE) 5000 UNIT/ML IJ SOLN
INTRAMUSCULAR | Status: AC
  Filled 2019-09-01: qty 1

## 2019-09-01 MED ORDER — FLUORESCEIN SODIUM 1 MG OP STRP
1.0000 | ORAL_STRIP | Freq: Once | OPHTHALMIC | Status: AC
  Administered 2019-09-01: 20:00:00 1 via OPHTHALMIC
  Filled 2019-09-01: qty 1

## 2019-09-01 MED ORDER — CARVEDILOL 12.5 MG PO TABS
12.5000 mg | ORAL_TABLET | Freq: Once | ORAL | Status: AC
  Administered 2019-09-01: 20:00:00 12.5 mg via ORAL
  Filled 2019-09-01: qty 1

## 2019-09-01 MED ORDER — HYDROCHLOROTHIAZIDE 12.5 MG PO CAPS
12.5000 mg | ORAL_CAPSULE | Freq: Once | ORAL | Status: AC
  Administered 2019-09-01: 20:00:00 12.5 mg via ORAL
  Filled 2019-09-01: qty 1

## 2019-09-01 NOTE — ED Provider Notes (Signed)
Jade Alvarez   CSN: [id number] Arrival date & time: 09/01/19  1802     History   Chief Complaint Chief Complaint  Patient presents with  . Hypertension    HPI  HPI  Jade Alvarez is a 58 year old female with PMH of HTN who presents to the ED with concern for hypertension. Pt reports she had been in her normal state of health prior to today. She went to an urgent care due to right eye redness. Reports redness of right eye over the last week. Denies pain or known injury. Denies vision changes. While at urgent care, her BP was noted to be high and she was sent to the ED for further evaluation. Patient denies any chest pain. No shortness of breath or palpitations. States she had some bilateral shoulder pain earlier in the day from heavy lifting at work that resolved with aleve . Denies any ongoing shoulder pain. Reports she has not taken her blood pressure medications in years. Denies any headache or vision changes.   Past Medical History:  Diagnosis Date  . Hypertension     Patient Active Problem List   Diagnosis Date Noted  . Hypertensive crisis 09/02/2019  . Acute conjunctivitis of right eye   . Hypertension 01/03/2012    History reviewed. No pertinent surgical history.   OB History   No obstetric history on file.      Home Medications    Prior to Admission medications   Not on File    Family History History reviewed. No pertinent family history.  Social History Social History   Tobacco Use  . Smoking status: Never Smoker  Substance Use Topics  . Alcohol use: Not on file  . Drug use: Not on file     Allergies   Penicillins   Review of Systems Review of Systems  Constitutional: Negative for chills and fever.  HENT: Negative for ear pain and sore throat.   Eyes: Positive for redness. Negative for photophobia, pain and visual disturbance.  Respiratory: Negative for cough, chest tightness and shortness  of breath.   Cardiovascular: Negative for chest pain and palpitations.  Gastrointestinal: Negative for abdominal pain and vomiting.  Genitourinary: Negative for dysuria and hematuria.  Musculoskeletal: Negative for arthralgias and back pain.  Skin: Negative for color change and rash.  Neurological: Negative for seizures and syncope.  Psychiatric/Behavioral: Negative for agitation and behavioral problems.  All other systems reviewed and are negative.    Physical Exam Updated Vital Signs BP (!) 153/107   Pulse 71   Temp 98.3 F (36.8 C) (Oral)   Resp (!) 21   Ht 5\' 2"  (1.575 m)   Wt 74.3 kg   SpO2 96%   BMI 29.96 kg/m   Physical Exam Vitals signs and nursing Alvarez reviewed.  Constitutional:      General: She is not in acute distress.    Appearance: She is well-developed.  HENT:     Head: Normocephalic and atraumatic.  Eyes:     Extraocular Movements: Extraocular movements intact.     Pupils: Pupils are equal, round, and reactive to light.     Comments: R eye with triangular shaped erythema of conjunctiva of medial aspect of eye No fluorescein uptake   Neck:     Musculoskeletal: Neck supple.  Cardiovascular:     Rate and Rhythm: Normal rate and regular rhythm.     Pulses: Normal pulses.     Heart sounds: No murmur.  Pulmonary:     Effort: Pulmonary effort is normal. No respiratory distress.     Breath sounds: Normal breath sounds.  Abdominal:     General: There is no distension.     Palpations: Abdomen is soft.     Tenderness: There is no abdominal tenderness.  Skin:    General: Skin is warm and dry.  Neurological:     General: No focal deficit present.     Mental Status: She is alert and oriented to person, place, and time.  Psychiatric:        Mood and Affect: Mood normal.      ED Treatments / Results  Labs (all labs ordered are listed, but only abnormal results are displayed) Labs Reviewed  BASIC METABOLIC PANEL - Abnormal; Notable for the following  components:      Result Value   Glucose, Bld 103 (*)    All other components within normal limits  TROPONIN I (HIGH SENSITIVITY) - Abnormal; Notable for the following components:   Troponin I (High Sensitivity) 91 (*)    All other components within normal limits  TROPONIN I (HIGH SENSITIVITY) - Abnormal; Notable for the following components:   Troponin I (High Sensitivity) 88 (*)    All other components within normal limits  SARS CORONAVIRUS 2 (TAT 6-24 HRS)  MRSA PCR SCREENING  CBC  BASIC METABOLIC PANEL  GLUCOSE, CAPILLARY  HIV ANTIBODY (ROUTINE TESTING W REFLEX)    EKG EKG Interpretation  Date/Time:  Monday September 01 2019 19:33:39 EST Ventricular Rate:  86 PR Interval:  158 QRS Duration: 97 QT Interval:  426 QTC Calculation: 510 R Axis:   33 Text Interpretation: Sinus rhythm Biatrial enlargement LVH with secondary repolarization abnormality Anterior ST elevation, probably due to LVH Borderline prolonged QT interval No significant change since last tracing Confirmed by Wandra Arthurs [phone]) on 09/01/2019 9:09:52 PM   Radiology Dg Chest Port 1 View  Result Date: 09/01/2019 CLINICAL DATA:  Hypertension EXAM: PORTABLE CHEST 1 VIEW COMPARISON:  None. FINDINGS: No consolidation, features of edema, pneumothorax, or effusion. Pulmonary vascularity is normally distributed. The cardiomediastinal contours are unremarkable for portable technique. No acute osseous or soft tissue abnormality. IMPRESSION: No acute cardiopulmonary abnormality. Electronically Signed   By: Lovena Le M.D.   On: 09/01/2019 19:47    Procedures Procedures (including critical care time)  Medications Ordered in ED Medications  enoxaparin (LOVENOX) injection 40 mg (40 mg Subcutaneous Given 09/02/19 0504)  sodium chloride flush (NS) 0.9 % injection 3 mL (3 mLs Intravenous Not Given 09/02/19 1136)  sodium chloride flush (NS) 0.9 % injection 3 mL (has no administration in time range)  0.9 %  sodium chloride  infusion (has no administration in time range)  acetaminophen (TYLENOL) tablet 650 mg (has no administration in time range)    Or  acetaminophen (TYLENOL) suppository 650 mg (has no administration in time range)  HYDROcodone-acetaminophen (NORCO/VICODIN) 5-325 MG per tablet 1-2 tablet (has no administration in time range)  ondansetron (ZOFRAN) tablet 4 mg (has no administration in time range)    Or  ondansetron (ZOFRAN) injection 4 mg (has no administration in time range)  senna-docusate (Senokot-S) tablet 1 tablet (has no administration in time range)  lisinopril (ZESTRIL) tablet 20 mg (20 mg Oral Given 09/02/19 0815)  nitroGLYCERIN 50 mg in dextrose 5 % 250 mL (0.2 mg/mL) infusion (0 mcg/min Intravenous Stopped 09/02/19 0954)  trimethoprim-polymyxin b (POLYTRIM) ophthalmic solution 2 drop (2 drops Right Eye Given 09/02/19 1252)  hydrochlorothiazide (HYDRODIURIL)  tablet 25 mg (25 mg Oral Given 09/02/19 0815)  lisinopril (ZESTRIL) tablet 20 mg (20 mg Oral Given 09/01/19 2001)  hydrochlorothiazide (MICROZIDE) capsule 12.5 mg (12.5 mg Oral Given 09/01/19 2001)  carvedilol (COREG) tablet 12.5 mg (12.5 mg Oral Given 09/01/19 2001)  fluorescein ophthalmic strip 1 strip (1 strip Right Eye Given 09/01/19 2002)     Initial Impression / Assessment and Plan / ED Course  I have reviewed the triage vital signs and the nursing notes.  Pertinent labs & imaging results that were available during my care of the patient were reviewed by me and considered in my medical decision making (see chart for details).  On arrival, pt is afebrile, Hypertensive. She is currently asymptomatic.  EKG: STE ov V1 with findings consistent with LVH. Lateral T wave inversions present. Posterior EKG shows similar STE of V1. No obvious STEMI criteria and patient adamantly denies chest pain or anginal equivalents.  Concern for hypertensive urgency leading to elevated troponin. Overall, likely longstanding uncontrolled HTN.    Of Alvarez, R eye redness most consistent with developing Pterygium. No pain or vision changes. No fluorescein uptake to suggest corneal abrasion. No watering/itching/cold like symptoms.   Pt given PO Lisinopril-HCTZ and Coreg as previously prescribed for outpatient therapy. Improvement of BP. Upon reassessment, she remains asymptomatic. Initial troponin elevated at 91. Plan for delta troponin and likely admission. Patient agreeable.  Handoff given to Dr. Dina Rich.   Final Clinical Impressions(s) / ED Diagnoses   Final diagnoses:  Hypertensive urgency    ED Discharge Orders    None       Burns Spain, MD 09/02/19 1413    Drenda Freeze, MD 09/05/19 1511    Drenda Freeze, MD 09/10/19 [phone]

## 2019-09-01 NOTE — ED Notes (Signed)
Repeat anteior EKG performed and a posterior EKG performed.

## 2019-09-01 NOTE — ED Triage Notes (Addendum)
Pt endorses being sent by [facility] due to hypertension. She originally went there to be seen for eye redness and they told her that her BP was elevated. Pt has hx of htn but is not on meds. Pt denies CP, shob or any other cardiac sx. BP 256/132

## 2019-09-02 ENCOUNTER — Encounter (HOSPITAL_COMMUNITY): Payer: Self-pay | Admitting: Family Medicine

## 2019-09-02 ENCOUNTER — Observation Stay (HOSPITAL_BASED_OUTPATIENT_CLINIC_OR_DEPARTMENT_OTHER): Payer: 59

## 2019-09-02 DIAGNOSIS — I169 Hypertensive crisis, unspecified: Principal | ICD-10-CM

## 2019-09-02 DIAGNOSIS — H1031 Unspecified acute conjunctivitis, right eye: Secondary | ICD-10-CM

## 2019-09-02 LAB — HIV ANTIBODY (ROUTINE TESTING W REFLEX): HIV Screen 4th Generation wRfx: NONREACTIVE — AB

## 2019-09-02 LAB — ECHOCARDIOGRAM COMPLETE
Height: 62 in
Weight: 2620.83 oz

## 2019-09-02 LAB — BASIC METABOLIC PANEL
Anion gap: 13 (ref 5–15)
BUN: 8 mg/dL (ref 6–20)
CO2: 25 mmol/L (ref 22–32)
Calcium: 9.2 mg/dL (ref 8.9–10.3)
Chloride: 101 mmol/L (ref 98–111)
Creatinine, Ser: 0.99 mg/dL (ref 0.44–1.00)
GFR calc Af Amer: >60 mL/min (ref >60)
GFR calc non Af Amer: >60 mL/min (ref >60)
Glucose, Bld: 97 mg/dL (ref 70–99)
Potassium: 3.8 mmol/L (ref 3.5–5.1)
Sodium: 139 mmol/L (ref 135–145)

## 2019-09-02 LAB — SARS CORONAVIRUS 2 (TAT 6-24 HRS): SARS Coronavirus 2: NEGATIVE

## 2019-09-02 LAB — MRSA PCR SCREENING: MRSA by PCR: NEGATIVE

## 2019-09-02 LAB — GLUCOSE, CAPILLARY
Glucose-Capillary: 88 mg/dL (ref 70–99)
Glucose-Capillary: 91 mg/dL (ref 70–99)

## 2019-09-02 LAB — TROPONIN I (HIGH SENSITIVITY): Troponin I (High Sensitivity): 88 ng/L — ABNORMAL HIGH (ref <18)

## 2019-09-02 MED ORDER — SENNOSIDES-DOCUSATE SODIUM 8.6-50 MG PO TABS: 1.0000 | ORAL_TABLET | Freq: Every evening | ORAL | Status: DC | PRN

## 2019-09-02 MED ORDER — HYDROCHLOROTHIAZIDE 25 MG PO TABS
25.0000 mg | ORAL_TABLET | Freq: Every day | ORAL | Status: DC
  Administered 2019-09-02 – 2019-09-03 (×2): 25 mg via ORAL
  Filled 2019-09-02 (×2): qty 1

## 2019-09-02 MED ORDER — HYDROCODONE-ACETAMINOPHEN 5-325 MG PO TABS: 1.0000 | ORAL_TABLET | ORAL | Status: DC | PRN

## 2019-09-02 MED ORDER — LISINOPRIL 20 MG PO TABS
20.0000 mg | ORAL_TABLET | Freq: Every day | ORAL | Status: DC
  Administered 2019-09-02 – 2019-09-03 (×2): 20 mg via ORAL
  Filled 2019-09-02 (×2): qty 1

## 2019-09-02 MED ORDER — POLYMYXIN B-TRIMETHOPRIM 10000-0.1 UNIT/ML-% OP SOLN: 2.0000 [drp] | Freq: Four times a day (QID) | OPHTHALMIC | Status: DC

## 2019-09-02 MED ORDER — SODIUM CHLORIDE 0.9% FLUSH
3.0000 mL | Freq: Two times a day (BID) | INTRAVENOUS | Status: DC
  Administered 2019-09-02 – 2019-09-03 (×3): 3 mL via INTRAVENOUS

## 2019-09-02 MED ORDER — ONDANSETRON HCL 4 MG/2ML IJ SOLN: 4.0000 mg | Freq: Four times a day (QID) | INTRAMUSCULAR | Status: DC | PRN

## 2019-09-02 MED ORDER — AMLODIPINE BESYLATE 5 MG PO TABS
5.0000 mg | ORAL_TABLET | Freq: Every day | ORAL | Status: DC
  Administered 2019-09-02 – 2019-09-03 (×2): 5 mg via ORAL
  Filled 2019-09-02 (×2): qty 1

## 2019-09-02 MED ORDER — HYDROCHLOROTHIAZIDE 12.5 MG PO CAPS: 12.5000 mg | ORAL_CAPSULE | Freq: Every day | ORAL | Status: DC

## 2019-09-02 MED ORDER — ACETAMINOPHEN 325 MG PO TABS: 650.0000 mg | ORAL_TABLET | Freq: Four times a day (QID) | ORAL | Status: DC | PRN

## 2019-09-02 MED ORDER — HYDRALAZINE HCL 20 MG/ML IJ SOLN
5.0000 mg | INTRAMUSCULAR | Status: DC | PRN
  Administered 2019-09-02: 22:00:00 5 mg via INTRAVENOUS
  Filled 2019-09-02: qty 1

## 2019-09-02 MED ORDER — ACETAMINOPHEN 650 MG RE SUPP: 650.0000 mg | Freq: Four times a day (QID) | RECTAL | Status: DC | PRN

## 2019-09-02 MED ORDER — SODIUM CHLORIDE 0.9% FLUSH: 3.0000 mL | INTRAVENOUS | Status: DC | PRN

## 2019-09-02 MED ORDER — SODIUM CHLORIDE 0.9 % IV SOLN: 250.0000 mL | INTRAVENOUS | Status: DC | PRN

## 2019-09-02 MED ORDER — ONDANSETRON HCL 4 MG PO TABS: 4.0000 mg | ORAL_TABLET | Freq: Four times a day (QID) | ORAL | Status: DC | PRN

## 2019-09-02 MED ORDER — POLYMYXIN B-TRIMETHOPRIM 10000-0.1 UNIT/ML-% OP SOLN
2.0000 [drp] | Freq: Four times a day (QID) | OPHTHALMIC | Status: DC
  Administered 2019-09-02 – 2019-09-03 (×5): 2 [drp] via OPHTHALMIC
  Filled 2019-09-02: qty 10

## 2019-09-02 MED ORDER — CARVEDILOL 12.5 MG PO TABS: 12.5000 mg | ORAL_TABLET | Freq: Two times a day (BID) | ORAL | Status: DC

## 2019-09-02 MED ORDER — NITROGLYCERIN IN D5W 200-5 MCG/ML-% IV SOLN
0.0000 ug/min | INTRAVENOUS | Status: DC
  Administered 2019-09-02: 05:00:00 5 ug/min via INTRAVENOUS
  Filled 2019-09-02: qty 250

## 2019-09-02 MED ORDER — ENOXAPARIN SODIUM 40 MG/0.4ML ~~LOC~~ SOLN
40.0000 mg | SUBCUTANEOUS | Status: DC
  Administered 2019-09-02 – 2019-09-03 (×2): 40 mg via SUBCUTANEOUS
  Filled 2019-09-02 (×2): qty 0.4

## 2019-09-02 NOTE — ED Notes (Signed)
ED TO INPATIENT HANDOFF REPORT  ED Nurse Name and Phone #: Donny Pique Name/Age/Gender Jade Alvarez 58 y.o. female Room/Bed: 016C/016C  Code Status   Code Status: Full Code  Home/SNF/Other Home Patient oriented to: self, place, time and situation Is this baseline? Yes   Triage Complete: Triage complete  Chief Complaint Hypertension  Triage Note Pt endorses being sent by [facility] due to hypertension. She originally went there to be seen for eye redness and they told her that her BP was elevated. Pt has hx of htn but is not on meds. Pt denies CP, shob or any other cardiac sx. BP 256/132   Allergies Allergies  Allergen Reactions  . Penicillins     Did it involve swelling of the face/tongue/throat, SOB, or low BP?No Did it involve sudden or severe rash/hives, skin peeling, or any reaction on the inside of your mouth or nose?Yes Did you need to seek medical attention at a hospital or doctor's office? No When did it last happen? 58yrs old  If all above answers are "NO", may proceed with cephalosporin use.    Level of Care/Admitting Diagnosis ED Disposition    ED Disposition Condition Comment   Admit  Hospital Area: [facility] [[id number]]  Level of Care: Progressive [102]  Admit to Progressive based on following criteria: CARDIOVASCULAR & THORACIC of moderate stability with acute coronary syndrome symptoms/low risk myocardial infarction/hypertensive urgency/arrhythmias/heart failure potentially compromising stability and stable post cardiovascular intervention patients.  Covid Evaluation: Asymptomatic Screening Protocol (No Symptoms)  Diagnosis: Hypertensive crisis [id number]  Admitting Physician: Vianne Bulls [id number]  Attending Physician: Vianne Bulls [id number]  PT Class (Do Not Modify): Observation [104]  PT Acc Code (Do Not Modify): Observation [10022]       B Medical/Surgery History Past Medical History:  Diagnosis Date  .  Hypertension    History reviewed. No pertinent surgical history.   A IV Location/Drains/Wounds Patient Lines/Drains/Airways Status   Active Line/Drains/Airways    None          Intake/Output Last 24 hours No intake or output data in the 24 hours ending 09/02/19 0347  Labs/Imaging Results for orders placed or performed during the hospital encounter of 09/01/19 (from the past 48 hour(s))  Basic metabolic panel     Status: Abnormal   Collection Time: 09/01/19 10:24 PM  Result Value Ref Range   Sodium 139 135 - 145 mmol/L   Potassium 3.6 3.5 - 5.1 mmol/L   Chloride 103 98 - 111 mmol/L   CO2 23 22 - 32 mmol/L   Glucose, Bld 103 (H) 70 - 99 mg/dL   BUN 11 6 - 20 mg/dL   Creatinine, Ser 0.98 0.44 - 1.00 mg/dL   Calcium 9.1 8.9 - 10.3 mg/dL   GFR calc non Af Amer >60 >60 mL/min   GFR calc Af Amer >60 >60 mL/min   Anion gap 13 5 - 15    Comment: Performed at [facility] [address], [place] [address]  CBC     Status: None   Collection Time: 09/01/19 10:24 PM  Result Value Ref Range   WBC 8.5 4.0 - 10.5 K/uL   RBC 4.63 3.87 - 5.11 MIL/uL   Hemoglobin 13.5 12.0 - 15.0 g/dL   HCT 42.7 36.0 - 46.0 %   MCV 92.2 80.0 - 100.0 fL   MCH 29.2 26.0 - 34.0 pg   MCHC 31.6 30.0 - 36.0 g/dL  RDW 13.3 11.5 - 15.5 %   Platelets 328 150 - 400 K/uL   nRBC 0.0 0.0 - 0.2 %    Comment: Performed at [facility], [place] [address], [place], [place] [address]  Troponin I (High Sensitivity)     Status: Abnormal   Collection Time: 09/01/19 10:24 PM  Result Value Ref Range   Troponin I (High Sensitivity) 91 (H) <18 ng/L    Comment: (NOTE) Elevated high sensitivity troponin I (hsTnI) values and significant  changes across serial measurements may suggest ACS but many other  chronic and acute conditions are known to elevate hsTnI results.  Refer to the "Links" section for chest pain algorithms and additional  guidance. Performed at [facility], [place] [address]  [place], [place] [address]   Troponin I (High Sensitivity)     Status: Abnormal   Collection Time: 09/02/19 12:30 AM  Result Value Ref Range   Troponin I (High Sensitivity) 88 (H) <18 ng/L    Comment: (NOTE) Elevated high sensitivity troponin I (hsTnI) values and significant  changes across serial measurements may suggest ACS but many other  chronic and acute conditions are known to elevate hsTnI results.  Refer to the "Links" section for chest pain algorithms and additional  guidance. Performed at [facility], [place] [address] [place], [place] [address]    Dg Chest Port 1 View  Result Date: 09/01/2019 CLINICAL DATA:  Hypertension EXAM: PORTABLE CHEST 1 VIEW COMPARISON:  None. FINDINGS: No consolidation, features of edema, pneumothorax, or effusion. Pulmonary vascularity is normally distributed. The cardiomediastinal contours are unremarkable for portable technique. No acute osseous or soft tissue abnormality. IMPRESSION: No acute cardiopulmonary abnormality. Electronically Signed   By: Lovena Le M.D.   On: 09/01/2019 19:47    Pending Labs Unresulted Labs (From admission, onward)    Start     Ordered   09/09/19 0500  Creatinine, serum  (enoxaparin (LOVENOX)    CrCl >/= 30 ml/min)  Weekly,   R    Comments: while on enoxaparin therapy    09/02/19 0251   09/02/19 0500  HIV Antibody (routine testing w rflx)  (HIV Antibody (Routine testing w reflex) panel)  Tomorrow morning,   R     09/02/19 0251   09/02/19 [id number]  Basic metabolic panel  Tomorrow morning,   R     09/02/19 0251   09/02/19 0223  SARS CORONAVIRUS 2 (TAT 6-24 HRS) Nasopharyngeal Nasopharyngeal Swab  (Asymptomatic/Tier 2 Patients Labs)  Once,   STAT    Question Answer Comment  Is this test for diagnosis or screening Screening   Symptomatic for COVID-19 as defined by CDC No   Hospitalized for COVID-19 No   Admitted to ICU for COVID-19 No   Previously tested for COVID-19 No   Resident in a congregate (group) care  setting No   Employed in healthcare setting No   Pregnant No      09/02/19 0222          Vitals/Pain Today's Vitals   09/01/19 1931 09/01/19 1938 09/01/19 2315 09/02/19 0227  BP: (!) 179/137  (!) 186/111 (!) 182/116  Pulse: 88  68 71  Resp: (!) 23  17 18   Temp:      TempSrc:      SpO2: 99%  97% 98%  Weight:  76.2 kg    Height:  5\' 2"  (1.575 m)    PainSc:   0-No pain     Isolation Precautions No  active isolations  Medications Medications  enoxaparin (LOVENOX) injection 40 mg (has no administration in time range)  sodium chloride flush (NS) 0.9 % injection 3 mL (has no administration in time range)  sodium chloride flush (NS) 0.9 % injection 3 mL (has no administration in time range)  0.9 %  sodium chloride infusion (has no administration in time range)  acetaminophen (TYLENOL) tablet 650 mg (has no administration in time range)    Or  acetaminophen (TYLENOL) suppository 650 mg (has no administration in time range)  HYDROcodone-acetaminophen (NORCO/VICODIN) 5-325 MG per tablet 1-2 tablet (has no administration in time range)  ondansetron (ZOFRAN) tablet 4 mg (has no administration in time range)    Or  ondansetron (ZOFRAN) injection 4 mg (has no administration in time range)  senna-docusate (Senokot-S) tablet 1 tablet (has no administration in time range)  hydrochlorothiazide (MICROZIDE) capsule 12.5 mg (has no administration in time range)  lisinopril (ZESTRIL) tablet 20 mg (has no administration in time range)  carvedilol (COREG) tablet 12.5 mg (has no administration in time range)  nitroGLYCERIN 50 mg in dextrose 5 % 250 mL (0.2 mg/mL) infusion (has no administration in time range)  trimethoprim-polymyxin b (POLYTRIM) ophthalmic solution 2 drop (has no administration in time range)  lisinopril (ZESTRIL) tablet 20 mg (20 mg Oral Given 09/01/19 2001)  hydrochlorothiazide (MICROZIDE) capsule 12.5 mg (12.5 mg Oral Given 09/01/19 2001)  carvedilol (COREG) tablet 12.5 mg  (12.5 mg Oral Given 09/01/19 2001)  fluorescein ophthalmic strip 1 strip (1 strip Right Eye Given 09/01/19 2002)    Mobility walks Low fall risk   Focused Assessments Cardiac Assessment Handoff:  Cardiac Rhythm: Normal sinus rhythm(ST elevation noted; Md aware) No results found for: CKTOTAL, CKMB, CKMBINDEX, TROPONINI No results found for: DDIMER Does the Patient currently have chest pain? No      R Recommendations: See Admitting Provider Note  Report given to:   Additional Notes:

## 2019-09-02 NOTE — ED Provider Notes (Signed)
Patient signed out pending repeat troponin and blood pressure recheck.  In brief was noted to urgent care to have extremely high blood pressures.  Has not had access to her blood pressure medicine.  Had some vague right shoulder/chest pain.  EKG noted to have some nonspecific changes.  Initial troponin was 91.  Plan for repeat troponin for trending purposes and repeat blood pressure.  Will likely need admission for hypertensive urgency.  2:22 AM Repeat troponin stable at 88.  However, given EKG changes and significantly elevated blood pressures, feel she needs admission for blood pressure stabilization.  Most recent blood pressure 186/111.  Discussed with Dr. Myna Hidalgo who will admit the patient.   Physical Exam  BP (!) 186/111   Pulse 68   Temp 98.3 F (36.8 C) (Oral)   Resp 17   Ht 1.575 m (5\' 2" )   Wt 76.2 kg   SpO2 97%   BMI 30.73 kg/m    MDM   Problem List Items Addressed This Visit    None    Visit Diagnoses    Hypertensive urgency    -  Primary   Relevant Medications   lisinopril (ZESTRIL) tablet 20 mg (Completed)   hydrochlorothiazide (MICROZIDE) capsule 12.5 mg (Completed)   carvedilol (COREG) tablet 12.5 mg (Completed)          Cassidie Veiga, Barbette Hair, MD 09/02/19 [phone]

## 2019-09-02 NOTE — Progress Notes (Signed)
Patient seen and examined at bedside, patient admitted after midnight, please see earlier detailed admission note by Vianne Bulls, MD. Briefly, patient presented with a right red eye and hypertension. She is symptomatic from the hypertension. Started on oral lisinopril, Coreg, hydrochlorothiazide in addition to nitro drip today  Hypertensive urgency -Continue nitro drip and titrate to off -Continue lisinopril, hydrochlorothiazide (increased to 25 mg daily) -Will discontinue Coreg at this time and manage with lisinopril/hctz and titrate up as needed -Agree with systolic goal of around [id number] mmHg -Transthoracic Echocardiogram secondary to abnormal EKG worrisome for LVH. Chest x-ray not suggestive of cardiomegaly  Conjunctivitis -Continue Polytrim   Cordelia Poche, MD [facility] 09/02/2019, 7:55 AM

## 2019-09-02 NOTE — Progress Notes (Signed)
  Echocardiogram 2D Echocardiogram has been performed.  Jade Alvarez 09/02/2019, 11:44 AM

## 2019-09-02 NOTE — H&P (Signed)
History and Physical    Jade Alvarez [id number] DOB: [birth year] DOA: 09/01/2019  PCP: Denita Lung, MD   Patient coming from: home   Chief Complaint: Red eye, high BP   HPI: Jade Alvarez is a 58 y.o. female with medical history significant for hypertension, no presenting to the emergency department for evaluation of elevated blood pressure.  The patient reports that she had been in her usual state of health until two weeks ago when she noted some redness involving her right eye.  There had not been any trauma and no pain, itching, or drainage. She went to urgent care this evening for evaluation of this, was noted to be severely hypertensive, and directed to the ED.  She notes that she had previously been on multiple blood pressure medications, but has not taken any in the past couple years.  Patient denies chest pain, shortness of breath, leg swelling, headache, change in vision or hearing, or focal numbness or weakness.  ED Course: Upon arrival to the ED, patient is found to be afebrile, saturating well on room air, and hypertensive to 256/132.  EKG features sinus rhythm with LVH and repolarization abnormality.  Chest x-ray is negative for acute cardiopulmonary disease.  Chemistry panel and CBC are normal.  Troponin was elevated to 91.  ED physician discussed case with cardiology who recommended trending troponin.  Patient was treated with Coreg, hydrochlorothiazide, and lisinopril in the ED.  Second troponin was down slightly to 88.  COVID-19 screening test not yet resulted.  Review of Systems:  All other systems reviewed and apart from HPI, are negative.  Past Medical History:  Diagnosis Date  . Hypertension     History reviewed. No pertinent surgical history.   reports that she has never smoked. She does not have any smokeless tobacco history on file. No history on file for alcohol and drug.  Allergies  Allergen Reactions  . Penicillins     Did it involve swelling  of the face/tongue/throat, SOB, or low BP?No Did it involve sudden or severe rash/hives, skin peeling, or any reaction on the inside of your mouth or nose?Yes Did you need to seek medical attention at a hospital or doctor's office? No When did it last happen? 58yrs old  If all above answers are "NO", may proceed with cephalosporin use.    History reviewed. No pertinent family history.   Prior to Admission medications   Not on File    Physical Exam: Vitals:   09/01/19 1931 09/01/19 1938 09/01/19 2315 09/02/19 0227  BP: (!) 179/137  (!) 186/111 (!) 182/116  Pulse: 88  68 71  Resp: (!) 23  17 18   Temp:      TempSrc:      SpO2: 99%  97% 98%  Weight:  76.2 kg    Height:  5\' 2"  (1.575 m)      Constitutional: NAD, calm  Eyes: PERTLA, lids and conjunctivae normal ENMT: Mucous membranes are moist. Posterior pharynx clear of any exudate or lesions.   Neck: normal, supple, no masses, no thyromegaly Respiratory: no wheezing, no crackles. Normal respiratory effort. No accessory muscle use.  Cardiovascular: S1 & S2 heard, regular rate and rhythm. No extremity edema.  . Abdomen: No distension, no tenderness, soft. Bowel sounds normal.  Musculoskeletal: no clubbing / cyanosis. No joint deformity upper and lower extremities.    Skin: no significant rashes, lesions, ulcers. Warm, dry, well-perfused. Neurologic: CN 2-12 grossly intact. Sensation intact. Strength 5/5 in all 4  limbs.  Psychiatric: Alert and oriented x 3. Very pleasant, cooperative.    Labs on Admission: I have personally reviewed following labs and imaging studies  CBC: Recent Labs  Lab 09/01/19 2224  WBC 8.5  HGB 13.5  HCT 42.7  MCV 92.2  PLT [id number]   Basic Metabolic Panel: Recent Labs  Lab 09/01/19 2224  NA 139  K 3.6  CL 103  CO2 23  GLUCOSE 103*  BUN 11  CREATININE 0.98  CALCIUM 9.1   GFR: Estimated Creatinine Clearance: 58.3 mL/min (by C-G formula based on SCr of 0.98 mg/dL). Liver Function Tests: No  results for input(s): AST, ALT, ALKPHOS, BILITOT, PROT, ALBUMIN in the last 168 hours. No results for input(s): LIPASE, AMYLASE in the last 168 hours. No results for input(s): AMMONIA in the last 168 hours. Coagulation Profile: No results for input(s): INR, PROTIME in the last 168 hours. Cardiac Enzymes: No results for input(s): CKTOTAL, CKMB, CKMBINDEX, TROPONINI in the last 168 hours. BNP (last 3 results) No results for input(s): PROBNP in the last 8760 hours. HbA1C: No results for input(s): HGBA1C in the last 72 hours. CBG: No results for input(s): GLUCAP in the last 168 hours. Lipid Profile: No results for input(s): CHOL, HDL, LDLCALC, TRIG, CHOLHDL, LDLDIRECT in the last 72 hours. Thyroid Function Tests: No results for input(s): TSH, T4TOTAL, FREET4, T3FREE, THYROIDAB in the last 72 hours. Anemia Panel: No results for input(s): VITAMINB12, FOLATE, FERRITIN, TIBC, IRON, RETICCTPCT in the last 72 hours. Urine analysis:    Component Value Date/Time   COLORURINE YELLOW 02/17/2010 2204   APPEARANCEUR CLOUDY (A) 02/17/2010 2204   LABSPEC 1.030 02/17/2010 2204   PHURINE 6.0 02/17/2010 2204   GLUCOSEU NEGATIVE 02/17/2010 2204   HGBUR SMALL (A) 02/17/2010 2204   BILIRUBINUR NEGATIVE 02/17/2010 2204   KETONESUR NEGATIVE 02/17/2010 2204   PROTEINUR NEGATIVE 02/17/2010 2204   UROBILINOGEN 1.0 02/17/2010 2204   NITRITE POSITIVE (A) 02/17/2010 2204   LEUKOCYTESUR MODERATE (A) 02/17/2010 2204   Sepsis Labs: @LABRCNTIP (procalcitonin:4,lacticidven:4) )No results found for this or any previous visit (from the past 240 hour(s)).   Radiological Exams on Admission: Dg Chest Port 1 View  Result Date: 09/01/2019 CLINICAL DATA:  Hypertension EXAM: PORTABLE CHEST 1 VIEW COMPARISON:  None. FINDINGS: No consolidation, features of edema, pneumothorax, or effusion. Pulmonary vascularity is normally distributed. The cardiomediastinal contours are unremarkable for portable technique. No acute  osseous or soft tissue abnormality. IMPRESSION: No acute cardiopulmonary abnormality. Electronically Signed   By: Lovena Le M.D.   On: 09/01/2019 19:47    EKG: Independently reviewed. Sinus rhythm, LVH with repolarization abnormality.   Assessment/Plan   1. Hypertensive crisis  - Patient has hx of HTN but not on any medications in years, was seen at urgent care for red eye and was found to be severely hypertensive  - BP as high as 256/132 in ED  - Troponin is elevated to 91, then 88 with repolarization abnormality on EKG and no prior EKG available  - Patient denies chest pain, headache, vision change, or numbness/weakness  - BP has improved with oral medications in ED but remains severely elevated  - Continue lisinopril, HCTZ, and Coreg, and start NTG infusion with titration for goal SBP 160-180 for now and then gradually normalize    2. Conjunctivitis  - Continue eye drops    PPE: mask, face shield  DVT prophylaxis: Lovenox  Code Status: Full  Family Communication: Discussed with patient  Consults called: None  Admission status: observation  Vianne Bulls, MD [facility] Pager [phone]  If 7PM-7AM, please contact night-coverage [email or web address] Password [email or web address]  09/02/2019, 2:29 AM

## 2019-09-03 ENCOUNTER — Encounter (HOSPITAL_COMMUNITY): Payer: Self-pay | Admitting: General Practice

## 2019-09-03 DIAGNOSIS — I169 Hypertensive crisis, unspecified: Secondary | ICD-10-CM | POA: Diagnosis not present

## 2019-09-03 MED ORDER — LISINOPRIL 20 MG PO TABS: 20.0000 mg | ORAL_TABLET | Freq: Every day | ORAL | 1 refills | Status: DC

## 2019-09-03 MED ORDER — SENNOSIDES-DOCUSATE SODIUM 8.6-50 MG PO TABS: 1.0000 | ORAL_TABLET | Freq: Every evening | ORAL | Status: DC | PRN

## 2019-09-03 MED ORDER — AMLODIPINE BESYLATE 5 MG PO TABS: 5.0000 mg | ORAL_TABLET | Freq: Every day | ORAL | 1 refills | Status: DC

## 2019-09-03 MED ORDER — HYDROCHLOROTHIAZIDE 25 MG PO TABS: 25.0000 mg | ORAL_TABLET | Freq: Every day | ORAL | 1 refills | Status: DC

## 2019-09-03 NOTE — Discharge Summary (Signed)
Physician Discharge Summary  Jade Alvarez [id number] DOB: 11-18-1960 DOA: 09/01/2019  PCP: Denita Lung, MD  Admit date: 09/01/2019 Discharge date: 09/03/2019  Admitted From: Home.  Disposition: Home.   Recommendations for Outpatient Follow-up:  1. Follow up with PCP in 1-2 weeks 2. Please obtain BMP/CBC in one week Please follow up with cardiology for the diastolic dysfunction seen on echocardiogram.   Discharge Condition:stable.  CODE STATUS:full code. Diet recommendation: Heart Healthy  Brief/Interim Summary:  Jade Alvarez is a 58 y.o. female with medical history significant for hypertension, no presenting to the emergency department for evaluation of elevated blood pressure. Upon arrival to the ED, patient is found to be afebrile, saturating well on room air, and hypertensive to 256/132.  EKG features sinus rhythm with LVH and repolarization abnormality.  Chest x-ray is negative for acute cardiopulmonary disease.  Chemistry panel and CBC are normal.  Troponin was elevated to 91.  She was admitted for hypertensive crisis.   Discharge Diagnoses:  Principal Problem:   Hypertensive crisis  Hypertensive crisis:  Resolved.  Echocardiogram shows  Left ventricular ejection fraction, by visual estimation, is 60 to 65%. The left ventricle has normal function. There is severely increased left ventricular hypertrophy. . Left ventricular diastolic parameters are consistent with Grade II diastolic dysfunction (pseudonormalization). Global right ventricle has normal systolic function.The right ventricular size is normal. Recommended to follow up with cardiology as outpatient.  Discharged the patient on amlodipine HCTZ AND lisinopril.   Conjunctivitis:  Improving.    Discharge Instructions  Discharge Instructions    (HEART FAILURE PATIENTS) Call MD:  Anytime you have any of the following symptoms: 1) 3 pound weight gain in 24 hours or 5 pounds in 1 week 2) shortness of  breath, with or without a dry hacking cough 3) swelling in the hands, feet or stomach 4) if you have to sleep on extra pillows at night in order to breathe.   Complete by: As directed    Diet - low sodium heart healthy   Complete by: As directed    Discharge instructions   Complete by: As directed    Please follow up with PCP in one week.   Increase activity slowly   Complete by: As directed      Allergies as of 09/03/2019      Reactions   Penicillins    Did it involve swelling of the face/tongue/throat, SOB, or low BP?No Did it involve sudden or severe rash/hives, skin peeling, or any reaction on the inside of your mouth or nose?Yes Did you need to seek medical attention at a hospital or doctor's office? No When did it last happen? 58yrs old  If all above answers are "NO", may proceed with cephalosporin use.      Medication List    TAKE these medications   amLODipine 5 MG tablet Commonly known as: NORVASC Take 1 tablet (5 mg total) by mouth daily. Start taking on: September 04, 2019   hydrochlorothiazide 25 MG tablet Commonly known as: HYDRODIURIL Take 1 tablet (25 mg total) by mouth daily. Start taking on: September 04, 2019   lisinopril 20 MG tablet Commonly known as: ZESTRIL Take 1 tablet (20 mg total) by mouth daily. Start taking on: September 04, 2019   senna-docusate 8.6-50 MG tablet Commonly known as: Senokot-S Take 1 tablet by mouth at bedtime as needed for mild constipation.      Follow-up Information    Denita Lung, MD. Schedule an appointment as soon  as possible for a visit in 1 week(s).   Specialty: Family Medicine Contact information: [place] [address] [phone]        [facility]. Schedule an appointment as soon as possible for a visit in 1 month(s).   Specialty: Cardiology Contact information: [address], [place] [address] [phone]          Allergies  Allergen Reactions  . Penicillins     Did it involve swelling of the face/tongue/throat, SOB, or low BP?No Did it involve sudden or severe rash/hives, skin peeling, or any reaction on the inside of your mouth or nose?Yes Did you need to seek medical attention at a hospital or doctor's office? No When did it last happen? 58yrs old  If all above answers are "NO", may proceed with cephalosporin use.    Consultations:  None.   Procedures/Studies: Dg Chest Port 1 View  Result Date: 09/01/2019 CLINICAL DATA:  Hypertension EXAM: PORTABLE CHEST 1 VIEW COMPARISON:  None. FINDINGS: No consolidation, features of edema, pneumothorax, or effusion. Pulmonary vascularity is normally distributed. The cardiomediastinal contours are unremarkable for portable technique. No acute osseous or soft tissue abnormality. IMPRESSION: No acute cardiopulmonary abnormality. Electronically Signed   By: Lovena Le M.D.   On: 09/01/2019 19:47    Echocardiogram  Left ventricular ejection fraction, by visual estimation, is 60 to 65%. The left ventricle has normal function. There is severely increased left ventricular hypertrophy.  2. Left ventricular diastolic parameters are consistent with Grade II diastolic dysfunction (pseudonormalization).  3. Global right ventricle has normal systolic function.The right ventricular size is normal   Subjective: No new complaints.   Discharge Exam: Vitals:   09/03/19 0804 09/03/19 1000  BP: (!) 146/89   Pulse: 98 74  Resp: 19 18  Temp: 98 F (36.7 C)   SpO2: 99% 97%   Vitals:   09/03/19 0754 09/03/19 0800 09/03/19 0804 09/03/19 1000  BP:  (!) 163/98 (!) 146/89   Pulse: 94 95 98 74  Resp: 20 (!) 29 19 18   Temp:   98 F (36.7 C)   TempSrc:   Oral   SpO2: 98% 99% 99% 97%  Weight:      Height:        General: Pt is alert, awake, not in acute distress Cardiovascular: RRR, S1/S2 +, no rubs, no gallops Respiratory: CTA bilaterally, no wheezing, no  rhonchi Abdominal: Soft, NT, ND, bowel sounds + Extremities: no edema, no cyanosis    The results of significant diagnostics from this hospitalization (including imaging, microbiology, ancillary and laboratory) are listed below for reference.     Microbiology: Recent Results (from the past 240 hour(s))  SARS CORONAVIRUS 2 (TAT 6-24 HRS) Nasopharyngeal Nasopharyngeal Swab     Status: None   Collection Time: 09/02/19  2:34 AM   Specimen: Nasopharyngeal Swab  Result Value Ref Range Status   SARS Coronavirus 2 NEGATIVE NEGATIVE Final    Comment: (NOTE) SARS-CoV-2 target nucleic acids are NOT DETECTED. The SARS-CoV-2 RNA is generally detectable in upper and lower respiratory specimens during the acute phase of infection. Negative results do not preclude SARS-CoV-2 infection, do not rule out co-infections with other pathogens, and should not be used as the sole basis for treatment or other patient management decisions. Negative results must be combined with clinical observations, patient history, and epidemiological information. The expected result is Negative. Fact Sheet for Patients: SugarRoll.be Fact Sheet for Healthcare Providers: https://www.woods-mathews.com/ This test  is not yet approved or cleared by the [place] and  has been authorized for detection and/or diagnosis of SARS-CoV-2 by FDA under an Emergency Use Authorization (EUA). This EUA will remain  in effect (meaning this test can be used) for the duration of the COVID-19 declaration under Section 56 4(b)(1) of the Act, 21 U.S.C. section 360bbb-3(b)(1), unless the authorization is terminated or revoked sooner. Performed at [facility], [place] [address], [place], [place] [address]   MRSA PCR Screening     Status: None   Collection Time: 09/02/19  5:20 AM   Specimen: Nasopharyngeal  Result Value Ref Range Status   MRSA by PCR NEGATIVE NEGATIVE Final    Comment:         The GeneXpert MRSA Assay (FDA approved for NASAL specimens only), is one component of a comprehensive MRSA colonization surveillance program. It is not intended to diagnose MRSA infection nor to guide or monitor treatment for MRSA infections. Performed at [facility], [place] [address] [place], [place] [address]      Labs: BNP (last 3 results) No results for input(s): BNP in the last 8760 hours. Basic Metabolic Panel: Recent Labs  Lab 09/01/19 2224 09/02/19 0338  NA 139 139  K 3.6 3.8  CL 103 101  CO2 23 25  GLUCOSE 103* 97  BUN 11 8  CREATININE 0.98 0.99  CALCIUM 9.1 9.2   Liver Function Tests: No results for input(s): AST, ALT, ALKPHOS, BILITOT, PROT, ALBUMIN in the last 168 hours. No results for input(s): LIPASE, AMYLASE in the last 168 hours. No results for input(s): AMMONIA in the last 168 hours. CBC: Recent Labs  Lab 09/01/19 2224  WBC 8.5  HGB 13.5  HCT 42.7  MCV 92.2  PLT 328   Cardiac Enzymes: No results for input(s): CKTOTAL, CKMB, CKMBINDEX, TROPONINI in the last 168 hours. BNP: Invalid input(s): POCBNP CBG: Recent Labs  Lab 09/02/19 1041 09/02/19 1649  GLUCAP 88 91   D-Dimer No results for input(s): DDIMER in the last 72 hours. Hgb A1c No results for input(s): HGBA1C in the last 72 hours. Lipid Profile No results for input(s): CHOL, HDL, LDLCALC, TRIG, CHOLHDL, LDLDIRECT in the last 72 hours. Thyroid function studies No results for input(s): TSH, T4TOTAL, T3FREE, THYROIDAB in the last 72 hours.  Invalid input(s): FREET3 Anemia work up No results for input(s): VITAMINB12, FOLATE, FERRITIN, TIBC, IRON, RETICCTPCT in the last 72 hours. Urinalysis    Component Value Date/Time   COLORURINE YELLOW 02/17/2010 2204   APPEARANCEUR CLOUDY (A) 02/17/2010 2204   LABSPEC 1.030 02/17/2010 2204   PHURINE 6.0 02/17/2010 2204   GLUCOSEU NEGATIVE 02/17/2010 2204   HGBUR SMALL (A) 02/17/2010 2204   BILIRUBINUR NEGATIVE 02/17/2010 2204    KETONESUR NEGATIVE 02/17/2010 2204   PROTEINUR NEGATIVE 02/17/2010 2204   UROBILINOGEN 1.0 02/17/2010 2204   NITRITE POSITIVE (A) 02/17/2010 2204   LEUKOCYTESUR MODERATE (A) 02/17/2010 2204   Sepsis Labs Invalid input(s): PROCALCITONIN,  WBC,  LACTICIDVEN Microbiology Recent Results (from the past 240 hour(s))  SARS CORONAVIRUS 2 (TAT 6-24 HRS) Nasopharyngeal Nasopharyngeal Swab     Status: None   Collection Time: 09/02/19  2:34 AM   Specimen: Nasopharyngeal Swab  Result Value Ref Range Status   SARS Coronavirus 2 NEGATIVE NEGATIVE Final    Comment: (NOTE) SARS-CoV-2 target nucleic acids are NOT DETECTED. The SARS-CoV-2 RNA is generally detectable in upper and lower respiratory specimens during the acute phase of infection. Negative results do  not preclude SARS-CoV-2 infection, do not rule out co-infections with other pathogens, and should not be used as the sole basis for treatment or other patient management decisions. Negative results must be combined with clinical observations, patient history, and epidemiological information. The expected result is Negative. Fact Sheet for Patients: SugarRoll.be Fact Sheet for Healthcare Providers: https://www.woods-mathews.com/ This test is not yet approved or cleared by the [place] FDA and  has been authorized for detection and/or diagnosis of SARS-CoV-2 by FDA under an Emergency Use Authorization (EUA). This EUA will remain  in effect (meaning this test can be used) for the duration of the COVID-19 declaration under Section 56 4(b)(1) of the Act, 21 U.S.C. section 360bbb-3(b)(1), unless the authorization is terminated or revoked sooner. Performed at [facility], [place] [address] [place], [place] [address]   MRSA PCR Screening     Status: None   Collection Time: 09/02/19  5:20 AM   Specimen: Nasopharyngeal  Result Value Ref Range Status   MRSA by PCR NEGATIVE NEGATIVE Final     Comment:        The GeneXpert MRSA Assay (FDA approved for NASAL specimens only), is one component of a comprehensive MRSA colonization surveillance program. It is not intended to diagnose MRSA infection nor to guide or monitor treatment for MRSA infections. Performed at [facility], [place] [address], [place], [place] [address]      Time coordinating discharge: 32 minutes  SIGNED:   Hosie Poisson, MD  [facility] 09/03/2019, 12:11 PM Pager   If 7PM-7AM, please contact night-coverage [email or web address] Password [email or web address]

## 2019-10-30 ENCOUNTER — Other Ambulatory Visit: Payer: Self-pay

## 2019-10-30 ENCOUNTER — Ambulatory Visit: Payer: 59 | Admitting: Family Medicine

## 2019-10-30 ENCOUNTER — Encounter: Payer: Self-pay | Admitting: Family Medicine

## 2019-10-30 VITALS — BP 146/90 | HR 92 | Temp 98.0°F | Wt 165.2 lb

## 2019-10-30 DIAGNOSIS — Z1231 Encounter for screening mammogram for malignant neoplasm of breast: Secondary | ICD-10-CM

## 2019-10-30 DIAGNOSIS — Z13220 Encounter for screening for lipoid disorders: Secondary | ICD-10-CM

## 2019-10-30 DIAGNOSIS — I10 Essential (primary) hypertension: Secondary | ICD-10-CM

## 2019-10-30 DIAGNOSIS — Z1211 Encounter for screening for malignant neoplasm of colon: Secondary | ICD-10-CM | POA: Diagnosis not present

## 2019-10-30 DIAGNOSIS — Z23 Encounter for immunization: Secondary | ICD-10-CM

## 2019-10-30 MED ORDER — AMLODIPINE BESYLATE 5 MG PO TABS: 5.0000 mg | ORAL_TABLET | Freq: Every day | ORAL | 3 refills | Status: DC

## 2019-10-30 MED ORDER — LISINOPRIL-HYDROCHLOROTHIAZIDE 20-12.5 MG PO TABS: 1.0000 | ORAL_TABLET | Freq: Every day | ORAL | 3 refills | Status: DC

## 2019-10-30 NOTE — Progress Notes (Signed)
   Subjective:    Patient ID: Jade Alvarez, female    DOB: [birth year], 59 y.o.   MRN: [id number]  HPI She is here after a several year absence.  She was seen recently in an urgent care center and found to have a very high blood pressure.  She was sent to the hospital and admitted there.  The hospital record including lab data was evaluated.  She was sent home on lisinopril/HCTZ and amlodipine.  She was supposed to follow-up with me approximately 1 month ago but has not until now.  She does have a blood pressure cuff at home and states the most recent blood pressure reading was below 130/80.  She has no chest pain, shortness of breath, PND or DOE.  She has not had any follow-up since last being seen by me in 2011.   Review of Systems     Objective:   Physical Exam Alert and in no distress. Tympanic membranes and canals are normal. Pharyngeal area is normal. Neck is supple without adenopathy or thyromegaly. Cardiac exam shows a regular sinus rhythm without murmurs or gallops. Lungs are clear to auscultation. The hospital discharge summary, EKG, blood work was reviewed       Assessment & Plan:  Essential hypertension - Plan: Comprehensive metabolic panel, amLODipine (NORVASC) 5 MG tablet, lisinopril-hydrochlorothiazide (ZESTORETIC) 20-12.5 MG tablet  Screening for colon cancer - Plan: Cologuard  Encounter for screening mammogram for malignant neoplasm of breast - Plan: MM Digital Screening  Need for Tdap vaccination - Plan: Tdap vaccine greater than or equal to 7yo IM  Need for shingles vaccine - Plan: Varicella-zoster vaccine IM (Shingrix)  Screening for lipid disorders - Plan: Lipid panel I took this opportunity to get her caught up on all of the medical needs including immunizations and health maintenance.  She will check her blood pressure at home and then come in here to be verified against ours.  Virtual visit in 1 month concerning that.  She is to set up for a complete exam down  the road. Over 45 minutes spent discussing all this with her.

## 2019-10-31 LAB — COMPREHENSIVE METABOLIC PANEL
ALT: 17 IU/L (ref 0–32)
AST: 18 IU/L (ref 0–40)
Albumin/Globulin Ratio: 1.8 (ref 1.2–2.2)
Albumin: 4.7 g/dL (ref 3.8–4.9)
Alkaline Phosphatase: 86 IU/L (ref 39–117)
BUN/Creatinine Ratio: 13 (ref 12–28)
BUN: 15 mg/dL (ref 8–27)
Bilirubin Total: <0.2 mg/dL (ref 0.0–1.2)
CO2: 23 mmol/L (ref 20–29)
Calcium: 9.4 mg/dL (ref 8.7–10.3)
Chloride: 99 mmol/L (ref 96–106)
Creatinine, Ser: 1.15 mg/dL — ABNORMAL HIGH (ref 0.57–1.00)
GFR calc Af Amer: 60 mL/min/{1.73_m2} (ref >59)
GFR calc non Af Amer: 52 mL/min/{1.73_m2} — ABNORMAL LOW (ref >59)
Globulin, Total: 2.6 g/dL (ref 1.5–4.5)
Glucose: 97 mg/dL (ref 65–99)
Potassium: 4 mmol/L (ref 3.5–5.2)
Sodium: 138 mmol/L (ref 134–144)
Total Protein: 7.3 g/dL (ref 6.0–8.5)

## 2019-10-31 LAB — LIPID PANEL
Chol/HDL Ratio: 4 ratio (ref 0.0–4.4)
Cholesterol, Total: 236 mg/dL — ABNORMAL HIGH (ref 100–199)
HDL: 59 mg/dL (ref >39)
LDL Chol Calc (NIH): 159 mg/dL — ABNORMAL HIGH (ref 0–99)
Triglycerides: 101 mg/dL (ref 0–149)
VLDL Cholesterol Cal: 18 mg/dL (ref 5–40)

## 2019-11-07 ENCOUNTER — Other Ambulatory Visit: Payer: 59

## 2019-11-07 ENCOUNTER — Other Ambulatory Visit: Payer: Self-pay

## 2019-11-07 ENCOUNTER — Telehealth: Payer: Self-pay

## 2019-11-07 NOTE — Telephone Encounter (Signed)
Pt came in for her follow up b/p check with her machine . Her first reading was 186/112 after that I check pt b/p reading with a larger cuff and reading was 164/100. Pt machine was used next and her reading was 190/108. Had the pt . Sit for a sec and last reading was 168/98 . Please advise. [place]

## 2019-11-07 NOTE — Progress Notes (Signed)
z

## 2019-11-08 NOTE — Telephone Encounter (Signed)
Have her check her blood pressure periodically as you taught her and set up a virtual visit in about a month to possibly readjust her medications.

## 2019-11-10 NOTE — Telephone Encounter (Signed)
lvm for pt to call back to the office. [place]

## 2019-11-12 NOTE — Telephone Encounter (Signed)
LVM for pt to call back to office. [facility] 

## 2020-01-30 ENCOUNTER — Encounter: Payer: Self-pay | Admitting: Family Medicine

## 2020-01-30 ENCOUNTER — Ambulatory Visit: Payer: 59 | Admitting: Family Medicine

## 2020-01-30 ENCOUNTER — Other Ambulatory Visit: Payer: Self-pay

## 2020-01-30 VITALS — BP 150/90 | HR 73 | Temp 96.9°F | Ht 63.25 in | Wt 167.6 lb

## 2020-01-30 DIAGNOSIS — Z90710 Acquired absence of both cervix and uterus: Secondary | ICD-10-CM

## 2020-01-30 DIAGNOSIS — Z0000 Encounter for general adult medical examination without abnormal findings: Secondary | ICD-10-CM | POA: Diagnosis not present

## 2020-01-30 DIAGNOSIS — I10 Essential (primary) hypertension: Secondary | ICD-10-CM

## 2020-01-30 DIAGNOSIS — Z1159 Encounter for screening for other viral diseases: Secondary | ICD-10-CM

## 2020-01-30 MED ORDER — AMLODIPINE BESYLATE 10 MG PO TABS: 10.0000 mg | ORAL_TABLET | Freq: Every day | ORAL | 3 refills | Status: DC

## 2020-01-30 NOTE — Progress Notes (Signed)
   Subjective:    Patient ID: Jade Alvarez, female    DOB: 1960/11/06, 59 y.o.   MRN: [id number]  HPI She is here for complete examination.  She does have underlying hypertension and continues on amlodipine and lisinopril.  She is having no difficulty with that.  She does have a blood pressure cuff at home which is apparently accurate.  She has a past history of hysterectomy for fibroids.  She was given a Cologuard on her last visit but is not processed it.  She does not smoke or drink.  Her work is going well.  She has been married for roughly 39 years.  Otherwise her family and social history as well as health maintenance and immunizations was reviewed. She had her last Covid shot on April 7.  Review of Systems  All other systems reviewed and are negative.      Objective:   Physical Exam Alert and in no distress. Tympanic membranes and canals are normal. Pharyngeal area is normal. Neck is supple without adenopathy or thyromegaly. Cardiac exam shows a regular sinus rhythm without murmurs or gallops. Lungs are clear to auscultation. Abdominal exam shows normal bowel sounds without masses or tenderness      Assessment & Plan:  Essential hypertension - Plan: amLODipine (NORVASC) 10 MG tablet  Encounter for hepatitis C screening test for low risk patient - Plan: Hepatitis C antibody  S/P hysterectomy - Fibroids I will increase her Norvasc.  She is to call in 1 month with her blood pressure.  She will also be scheduled back here in roughly a month for the Shingrix vaccine.

## 2020-01-30 NOTE — Patient Instructions (Signed)
Keep checking your blood pressure weekly for the next month and let me know what the result is.

## 2020-01-31 LAB — HEPATITIS C ANTIBODY: Hep C Virus Ab: <0.1 s/co ratio (ref 0.0–0.9)

## 2020-02-03 ENCOUNTER — Encounter: Payer: Self-pay | Admitting: Family Medicine

## 2020-02-27 ENCOUNTER — Other Ambulatory Visit: Payer: 59

## 2020-11-17 ENCOUNTER — Other Ambulatory Visit: Payer: Self-pay | Admitting: Family Medicine

## 2020-11-17 DIAGNOSIS — I10 Essential (primary) hypertension: Secondary | ICD-10-CM

## 2021-02-04 ENCOUNTER — Encounter: Payer: 59 | Admitting: Family Medicine

## 2021-02-07 ENCOUNTER — Encounter: Payer: Self-pay | Admitting: Family Medicine

## 2021-02-16 ENCOUNTER — Other Ambulatory Visit: Payer: Self-pay | Admitting: Family Medicine

## 2021-02-16 DIAGNOSIS — I10 Essential (primary) hypertension: Secondary | ICD-10-CM

## 2021-02-17 NOTE — Telephone Encounter (Signed)
Refilled medication that came in from pharmacy, left message asking patient to please call and schedule a CPE or med check ASAP.

## 2021-02-19 ENCOUNTER — Other Ambulatory Visit: Payer: Self-pay | Admitting: Family Medicine

## 2021-02-19 DIAGNOSIS — I10 Essential (primary) hypertension: Secondary | ICD-10-CM

## 2021-02-21 ENCOUNTER — Telehealth: Payer: Self-pay

## 2021-02-21 NOTE — Telephone Encounter (Signed)
Pt was called to advise the need for an appt. Refill was sent in for 30 days. [name]

## 2021-03-05 ENCOUNTER — Other Ambulatory Visit: Payer: Self-pay | Admitting: Family Medicine

## 2021-03-05 DIAGNOSIS — I10 Essential (primary) hypertension: Secondary | ICD-10-CM

## 2021-03-07 ENCOUNTER — Telehealth: Payer: Self-pay

## 2021-03-07 NOTE — Telephone Encounter (Signed)
LVM for pt to call back to schedule a med check or annual exam. [facility]

## 2021-03-16 ENCOUNTER — Other Ambulatory Visit: Payer: Self-pay

## 2021-03-16 ENCOUNTER — Telehealth: Payer: Self-pay | Admitting: Family Medicine

## 2021-03-16 DIAGNOSIS — I10 Essential (primary) hypertension: Secondary | ICD-10-CM

## 2021-03-16 MED ORDER — AMLODIPINE BESYLATE 10 MG PO TABS: 1.0000 | ORAL_TABLET | Freq: Every day | ORAL | 0 refills | Status: DC

## 2021-03-16 MED ORDER — LISINOPRIL-HYDROCHLOROTHIAZIDE 20-12.5 MG PO TABS: 1.0000 | ORAL_TABLET | Freq: Every day | ORAL | 0 refills | Status: DC

## 2021-03-16 NOTE — Telephone Encounter (Signed)
Pt called and states she missed her cpe appt. She made a medcheck appt for later this month but is out of medications. Please send amlodipine and lisinopril HCTZ to [facility].

## 2021-03-16 NOTE — Telephone Encounter (Signed)
Done KH 

## 2021-03-29 ENCOUNTER — Encounter: Payer: Self-pay | Admitting: Family Medicine

## 2021-04-08 ENCOUNTER — Ambulatory Visit: Payer: 59 | Admitting: Family Medicine

## 2021-04-08 ENCOUNTER — Other Ambulatory Visit: Payer: Self-pay

## 2021-04-08 VITALS — BP 138/88 | HR 68 | Temp 97.2°F | Ht 62.0 in | Wt 165.8 lb

## 2021-04-08 DIAGNOSIS — Z1211 Encounter for screening for malignant neoplasm of colon: Secondary | ICD-10-CM | POA: Diagnosis not present

## 2021-04-08 DIAGNOSIS — I10 Essential (primary) hypertension: Secondary | ICD-10-CM | POA: Diagnosis not present

## 2021-04-08 DIAGNOSIS — E1169 Type 2 diabetes mellitus with other specified complication: Secondary | ICD-10-CM | POA: Diagnosis not present

## 2021-04-08 DIAGNOSIS — E785 Hyperlipidemia, unspecified: Secondary | ICD-10-CM

## 2021-04-08 DIAGNOSIS — Z23 Encounter for immunization: Secondary | ICD-10-CM | POA: Diagnosis not present

## 2021-04-08 MED ORDER — EDARBYCLOR 40-25 MG PO TABS: 1.0000 | ORAL_TABLET | Freq: Every day | ORAL | 0 refills | Status: DC

## 2021-04-08 NOTE — Progress Notes (Signed)
   Subjective:    Patient ID: Jade Alvarez, female    DOB: January 20, 1961, 60 y.o.   MRN: [id number]  HPI She is here for a med check appointment.  She has started into regular workout routine with her son and feels very good about this.  She is on her blood pressure medications and is having no difficulty with that. Review of her record indicates the need for further shingles vaccine as well as colon cancer screening.  She has had a mammogram.  She has had a hysterectomy.  She also has a history of hyperlipidemia.  Review of Systems     Objective:   Physical Exam  Alert and in no distress.  Cardiac exam shows regular rhythm without murmurs gallops.  Lungs are clear to auscultation.      Assessment & Plan:   Essential hypertension - Plan: CBC with Differential/Platelet, Comprehensive metabolic panel, Azilsartan-Chlorthalidone (EDARBYCLOR) 40-25 MG TABS  Need for shingles vaccine - Plan: Varicella-zoster vaccine IM  Screening for colon cancer - Plan: Cologuard  Hyperlipidemia associated with type 2 diabetes mellitus ([facility]) - Plan: Lipid panel Since her blood pressure is not under good control, she will stop her lisinopril/HCTZ and amlodipine.  I will try her on Edarbychlor 40/25.  Samples given.  She will return here in 1 month for recheck.

## 2021-04-08 NOTE — Patient Instructions (Signed)
Stop your amlodipine and the lisinopril/HCTZ.  Start taking the new medication and come back here in a month.  I will call it in to [facility]

## 2021-04-09 LAB — COMPREHENSIVE METABOLIC PANEL
ALT: 16 IU/L (ref 0–32)
AST: 17 IU/L (ref 0–40)
Albumin/Globulin Ratio: 1.7 (ref 1.2–2.2)
Albumin: 4.6 g/dL (ref 3.8–4.8)
Alkaline Phosphatase: 79 IU/L (ref 44–121)
BUN/Creatinine Ratio: 14 (ref 12–28)
BUN: 14 mg/dL (ref 8–27)
Bilirubin Total: 0.3 mg/dL (ref 0.0–1.2)
CO2: 27 mmol/L (ref 20–29)
Calcium: 9.4 mg/dL (ref 8.7–10.3)
Chloride: 100 mmol/L (ref 96–106)
Creatinine, Ser: 1 mg/dL (ref 0.57–1.00)
Globulin, Total: 2.7 g/dL (ref 1.5–4.5)
Glucose: 89 mg/dL (ref 65–99)
Potassium: 4.4 mmol/L (ref 3.5–5.2)
Sodium: 140 mmol/L (ref 134–144)
Total Protein: 7.3 g/dL (ref 6.0–8.5)
eGFR: 64 mL/min/{1.73_m2} (ref >59)

## 2021-04-09 LAB — CBC WITH DIFFERENTIAL/PLATELET
Basophils Absolute: 0 10*3/uL (ref 0.0–0.2)
Basos: 0 %
EOS (ABSOLUTE): 0.1 10*3/uL (ref 0.0–0.4)
Eos: 2 %
Hematocrit: 41.2 % (ref 34.0–46.6)
Hemoglobin: 13.5 g/dL (ref 11.1–15.9)
Immature Grans (Abs): 0 10*3/uL (ref 0.0–0.1)
Immature Granulocytes: 0 %
Lymphocytes Absolute: 2 10*3/uL (ref 0.7–3.1)
Lymphs: 29 %
MCH: 29.1 pg (ref 26.6–33.0)
MCHC: 32.8 g/dL (ref 31.5–35.7)
MCV: 89 fL (ref 79–97)
Monocytes Absolute: 0.8 10*3/uL (ref 0.1–0.9)
Monocytes: 12 %
Neutrophils Absolute: 3.8 10*3/uL (ref 1.4–7.0)
Neutrophils: 57 %
Platelets: 357 10*3/uL (ref 150–450)
RBC: 4.64 x10E6/uL (ref 3.77–5.28)
RDW: 13.1 % (ref 11.7–15.4)
WBC: 6.7 10*3/uL (ref 3.4–10.8)

## 2021-04-09 LAB — LIPID PANEL
Chol/HDL Ratio: 4.4 ratio (ref 0.0–4.4)
Cholesterol, Total: 234 mg/dL — ABNORMAL HIGH (ref 100–199)
HDL: 53 mg/dL (ref >39)
LDL Chol Calc (NIH): 156 mg/dL — ABNORMAL HIGH (ref 0–99)
Triglycerides: 139 mg/dL (ref 0–149)
VLDL Cholesterol Cal: 25 mg/dL (ref 5–40)

## 2021-05-06 ENCOUNTER — Telehealth: Payer: Self-pay | Admitting: Family Medicine

## 2021-05-06 NOTE — Telephone Encounter (Signed)
Pt called and is requesting a refill for edarbyclor states it is working but does not have enough to last until her fu next week, pt would like it sent to the [facility]  would like a call back so she will not that this is called back  I will not be here after 12.30 so send this back to someone else if this is after

## 2021-05-13 ENCOUNTER — Ambulatory Visit: Payer: 59 | Admitting: Family Medicine

## 2021-05-13 ENCOUNTER — Encounter: Payer: Self-pay | Admitting: Family Medicine

## 2021-05-13 DIAGNOSIS — I10 Essential (primary) hypertension: Secondary | ICD-10-CM | POA: Diagnosis not present

## 2021-05-13 MED ORDER — EDARBYCLOR 40-25 MG PO TABS
1.0000 | ORAL_TABLET | Freq: Every day | ORAL | 3 refills | Status: DC
Start: 2021-05-13 — End: 2022-04-14

## 2021-05-13 NOTE — Progress Notes (Signed)
   Subjective:    Patient ID: Jade Alvarez, female    DOB: 12-21-1960, 60 y.o.   MRN: [id number]  HPI She is here for recheck on her blood pressure.  She unfortunately ran out of medications about a week ago although apparently she was sent a note to come by to get medications but did not.  Review of Systems     Objective:   Physical Exam  Alert and of no distress.  Blood pressure is recorded.      Assessment & Plan:  Essential hypertension - Plan: Azilsartan-Chlorthalidone (EDARBYCLOR) 40-25 MG TABS She was given a sample. She is also to bring her blood pressure cuff to measure to get IR so we get an accurate reading.  Recheck here in 6 weeks.

## 2021-06-09 ENCOUNTER — Other Ambulatory Visit: Payer: Self-pay | Admitting: Family Medicine

## 2021-06-09 DIAGNOSIS — I10 Essential (primary) hypertension: Secondary | ICD-10-CM

## 2021-06-24 ENCOUNTER — Other Ambulatory Visit: Payer: Self-pay

## 2021-06-24 ENCOUNTER — Ambulatory Visit (INDEPENDENT_AMBULATORY_CARE_PROVIDER_SITE_OTHER): Payer: 59 | Admitting: Family Medicine

## 2021-06-24 ENCOUNTER — Encounter: Payer: Self-pay | Admitting: Family Medicine

## 2021-06-24 VITALS — BP 118/74 | HR 83 | Temp 97.7°F | Wt 166.8 lb

## 2021-06-24 DIAGNOSIS — I10 Essential (primary) hypertension: Secondary | ICD-10-CM

## 2021-06-24 DIAGNOSIS — R195 Other fecal abnormalities: Secondary | ICD-10-CM

## 2021-06-24 HISTORY — DX: Other fecal abnormalities: R19.5

## 2021-06-24 LAB — COLOGUARD: Cologuard: POSITIVE — AB

## 2021-06-24 NOTE — Progress Notes (Signed)
   Subjective:    Patient ID: Jade Alvarez, female    DOB: 10/11/1961, 60 y.o.   MRN: [id number]  HPI She is here for recheck.  She is now taking Edarbyclor and having no difficulty with that medication.   Review of Systems     Objective:   Physical Exam  Alert and in no distress.  Blood pressure is recorded      Assessment & Plan:   Essential hypertension Her blood pressure is now well controlled.  I will continue her on this medication.  She was comfortable with that.

## 2021-06-29 LAB — COLOGUARD

## 2021-07-04 ENCOUNTER — Other Ambulatory Visit: Payer: Self-pay

## 2021-07-04 DIAGNOSIS — Z1211 Encounter for screening for malignant neoplasm of colon: Secondary | ICD-10-CM

## 2021-07-07 ENCOUNTER — Telehealth: Payer: Self-pay

## 2021-07-07 NOTE — Telephone Encounter (Signed)
Pt was advised of positive cologuard. Pt was also advised she should get a call form [facility] to schedule. [place]

## 2021-10-28 ENCOUNTER — Other Ambulatory Visit: Payer: Self-pay

## 2021-10-28 ENCOUNTER — Ambulatory Visit (INDEPENDENT_AMBULATORY_CARE_PROVIDER_SITE_OTHER): Payer: 59 | Admitting: Family Medicine

## 2021-10-28 ENCOUNTER — Encounter: Payer: Self-pay | Admitting: Family Medicine

## 2021-10-28 VITALS — BP 124/82 | HR 83 | Temp 97.5°F | Ht 62.0 in | Wt 162.0 lb

## 2021-10-28 DIAGNOSIS — I10 Essential (primary) hypertension: Secondary | ICD-10-CM | POA: Diagnosis not present

## 2021-10-28 DIAGNOSIS — Z1231 Encounter for screening mammogram for malignant neoplasm of breast: Secondary | ICD-10-CM | POA: Diagnosis not present

## 2021-10-28 DIAGNOSIS — E1169 Type 2 diabetes mellitus with other specified complication: Secondary | ICD-10-CM | POA: Diagnosis not present

## 2021-10-28 DIAGNOSIS — Z0000 Encounter for general adult medical examination without abnormal findings: Secondary | ICD-10-CM | POA: Diagnosis not present

## 2021-10-28 DIAGNOSIS — R195 Other fecal abnormalities: Secondary | ICD-10-CM | POA: Diagnosis not present

## 2021-10-28 DIAGNOSIS — E785 Hyperlipidemia, unspecified: Secondary | ICD-10-CM

## 2021-10-28 NOTE — Progress Notes (Signed)
° °  Subjective:    Patient ID: Jade Alvarez, female    DOB: Nov 13, 1960, 61 y.o.   MRN: [id number]  HPI She is here for complete examination.  She has made diet and exercise changes and is now doing much better with more energy and sleeping better.  She is very happy with this.  She continues on her Edarbyclor.  She did do a Cologuard which was positive.  She is married.  She has had a hysterectomy.  Family and social history as well as health maintenance and immunizations was reviewed.   Review of Systems  All other systems reviewed and are negative.     Objective:   Physical Exam Alert and in no distress. Tympanic membranes and canals are normal. Pharyngeal area is normal. Neck is supple without adenopathy or thyromegaly. Cardiac exam shows a regular sinus rhythm without murmurs or gallops. Lungs are clear to auscultation. Abdominal exam shows no masses or tenderness.       Assessment & Plan:  Routine general medical examination at a health care facility - Plan: CBC with Differential/Platelet, Comprehensive metabolic panel, Lipid panel  Essential hypertension - Plan: CBC with Differential/Platelet, Comprehensive metabolic panel  Hyperlipidemia associated with type 2 diabetes mellitus (HCC)  Positive colorectal cancer screening using Cologuard test - Plan: Ambulatory referral to Gastroenterology  Encounter for screening mammogram for malignant neoplasm of breast - Plan: MM Digital Screening Encouraged her to continue to take excellent care of her self.  I will renew her blood pressure med when it comes up for renewal.

## 2021-10-29 LAB — CBC WITH DIFFERENTIAL/PLATELET
Basophils Absolute: 0 10*3/uL (ref 0.0–0.2)
Basos: 1 %
EOS (ABSOLUTE): 0.1 10*3/uL (ref 0.0–0.4)
Eos: 2 %
Hematocrit: 39.7 % (ref 34.0–46.6)
Hemoglobin: 13.2 g/dL (ref 11.1–15.9)
Immature Grans (Abs): 0 10*3/uL (ref 0.0–0.1)
Immature Granulocytes: 0 %
Lymphocytes Absolute: 2.4 10*3/uL (ref 0.7–3.1)
Lymphs: 33 %
MCH: 29 pg (ref 26.6–33.0)
MCHC: 33.2 g/dL (ref 31.5–35.7)
MCV: 87 fL (ref 79–97)
Monocytes Absolute: 0.8 10*3/uL (ref 0.1–0.9)
Monocytes: 11 %
Neutrophils Absolute: 3.9 10*3/uL (ref 1.4–7.0)
Neutrophils: 53 %
Platelets: 355 10*3/uL (ref 150–450)
RBC: 4.55 x10E6/uL (ref 3.77–5.28)
RDW: 12.5 % (ref 11.7–15.4)
WBC: 7.3 10*3/uL (ref 3.4–10.8)

## 2021-10-29 LAB — COMPREHENSIVE METABOLIC PANEL
ALT: 17 IU/L (ref 0–32)
AST: 19 IU/L (ref 0–40)
Albumin/Globulin Ratio: 1.4 (ref 1.2–2.2)
Albumin: 4.6 g/dL (ref 3.8–4.8)
Alkaline Phosphatase: 69 IU/L (ref 44–121)
BUN/Creatinine Ratio: 12 (ref 12–28)
BUN: 14 mg/dL (ref 8–27)
Bilirubin Total: 0.3 mg/dL (ref 0.0–1.2)
CO2: 27 mmol/L (ref 20–29)
Calcium: 9.9 mg/dL (ref 8.7–10.3)
Chloride: 97 mmol/L (ref 96–106)
Creatinine, Ser: 1.13 mg/dL — ABNORMAL HIGH (ref 0.57–1.00)
Globulin, Total: 3.2 g/dL (ref 1.5–4.5)
Glucose: 91 mg/dL (ref 70–99)
Potassium: 3.7 mmol/L (ref 3.5–5.2)
Sodium: 139 mmol/L (ref 134–144)
Total Protein: 7.8 g/dL (ref 6.0–8.5)
eGFR: 55 mL/min/{1.73_m2} — ABNORMAL LOW (ref >59)

## 2021-10-29 LAB — LIPID PANEL
Chol/HDL Ratio: 4.4 ratio (ref 0.0–4.4)
Cholesterol, Total: 229 mg/dL — ABNORMAL HIGH (ref 100–199)
HDL: 52 mg/dL (ref >39)
LDL Chol Calc (NIH): 160 mg/dL — ABNORMAL HIGH (ref 0–99)
Triglycerides: 98 mg/dL (ref 0–149)
VLDL Cholesterol Cal: 17 mg/dL (ref 5–40)

## 2022-01-06 ENCOUNTER — Other Ambulatory Visit: Payer: Self-pay

## 2022-01-06 ENCOUNTER — Ambulatory Visit: Payer: 59 | Admitting: Family Medicine

## 2022-01-06 VITALS — BP 122/80 | HR 82 | Wt 162.8 lb

## 2022-01-06 DIAGNOSIS — M7062 Trochanteric bursitis, left hip: Secondary | ICD-10-CM | POA: Diagnosis not present

## 2022-01-06 NOTE — Patient Instructions (Signed)
Hip Bursitis ?Hip bursitis is swelling of one or more fluid-filled sacs (bursae) in your hip joint. This condition can cause pain, and your symptoms may come and go over time. ?What are the causes? ?Repeated use of your hip muscles. ?Injury to the hip. ?Weak butt muscles. ?Bone spurs. ?Infection. ?In some cases, the cause may not be known. ?What increases the risk? ?You are more likely to develop this condition if: ?You had a past hip injury or hip surgery. ?You have a condition, such as arthritis, gout, diabetes, or thyroid disease. ?You have spine problems. ?You have one leg that is shorter than the other. ?You run a lot or do long-distance running. ?You play sports where there is a risk of injury or falling, such as football, martial arts, or skiing. ?What are the signs or symptoms? ?Symptoms may come and go, and they often include: ?Pain in the hip or groin area. Pain may get worse when you move your hip. ?Tenderness and swelling of the hip. ?In rare cases, the bursa may become infected. If this happens, you may get a fever, as well as have warmth and redness in the hip area. ?How is this treated? ?This condition is treated by: ?Resting your hip. ?Icing your hip. ?Wrapping the hip area with an elastic bandage (compression wrap). ?Keeping the hip raised. ?Other treatments may include medicine, draining fluid out of the bursa, or using crutches, a cane, or a walker. Surgery may be needed, but this is rare. ?Long-term treatment may include doing exercises to help your strength and flexibility. It may also include lifestyle changes like losing weight to lessen the strain on your hip. ?Follow these instructions at home: ?Managing pain, stiffness, and swelling ?  ?If told, put ice on the painful area. ?Put ice in a plastic bag. ?Place a towel between your skin and the bag. ?Leave the ice on for 20 minutes, 2-3 times a day. ?Raise your hip by putting a pillow under your hips while you lie down. Stop if you feel  pain. ?If told, put heat on the affected area. Do this as often as told by your doctor. Use a moist heat pack or a heating pad as told by your doctor. ?Place a towel between your skin and the heat source. ?Leave the heat on for 20-30 minutes. ?Take off the heat if your skin turns bright red. This is very important if you are unable to feel pain, heat, or cold. You may have a greater risk of getting burned. ?Activity ?Do not use your hip to support your body weight until your doctor says that you can. ?Use crutches, a cane, or a walker as told by your doctor. ?If the affected leg is one that you use to drive, ask your doctor if it is safe to drive. ?Rest and protect your hip as much as you can until you feel better. ?Return to your normal activities as told by your doctor. Ask your doctor what activities are safe for you. ?Do exercises as told by your doctor. ?General instructions ?Take over-the-counter and prescription medicines only as told by your doctor. ?Gently rub and stretch your injured area as often as is comfortable. ?Wear elastic bandages only as told by your doctor. ?If one of your legs is shorter than the other, get fitted for a shoe insert or orthotic. ?Keep a healthy weight. Follow instructions from your doctor. ?Keep all follow-up visits as told by your doctor. This is important. ?How is this prevented? ?Exercise regularly, as  told by your doctor. ?Wear the right shoes for the sport you play. ?Warm up and stretch before being active. Cool down and stretch after being active. ?Take breaks often from repeated activity. ?Avoid activities that bother your hip or cause pain. ?Avoid sitting down for a long time. ?Where to find more information ?American Academy of Orthopa[employer] Surgeons: orthoinfo.aaos.org ?Contact a doctor if: ?You have a fever. ?You have new symptoms. ?You have trouble walking or doing everyday activities. ?You have pain that gets worse or does not get better with medicine. ?Your skin  around your hip is red. ?You get a feeling of warmth in your hip area. ?Get help right away if: ?You cannot move your hip. ?You have very bad pain. ?You cannot control the muscles in your feet. ?Summary ?Hip bursitis is swelling of one or more fluid-filled sacs (bursae) in your hip joint. ?Symptoms often come and go over time. ?This condition is often treated by resting and icing the hip. It also may help to keep the area raised and wrapped in an elastic bandage. Other treatments may be needed. ?This information is not intended to replace advice given to you by your health care provider. Make sure you discuss any questions you have with your health care provider. ?Heat for 20 minutes 3 times per day and take 2 Aleve twice per day for the next 10 to 14 days ?Document Revised: 08/04/2019 Document Reviewed: 06/10/2018 ?Elsevier Patient Education ? [place]. ? ?

## 2022-01-06 NOTE — Progress Notes (Signed)
? ?  Subjective:  ? ? Patient ID: Jade Alvarez, female    DOB: [birth year], 61 y.o.   MRN: [id number] ? ?HPI ?She complains of a 2-week history of left hip pain.  No history of injury or overuse.  No numbness, tingling or weakness.  She notes the pain going up steps and also she lies on that side at night. ? ? ?Review of Systems ? ?   ?Objective:  ? Physical Exam ?Alert and in no distress.  Tender to palpation over the left greater trochanter.  Full motion of the hip.  Negative straight leg raising. ? ? ? ? ? ?   ?Assessment & Plan:  ? ?Trochanteric bursitis of left hip ?I discussed the diagnosis with her in detail.  Information given in AVS.  Recommend heat for 20 minutes 3 times per day and 2 Aleve twice per day for the next 10 to 14 days.  If continued difficulty he will return here.  Discussed the possibility of an injection. ?

## 2022-01-13 ENCOUNTER — Ambulatory Visit (AMBULATORY_SURGERY_CENTER): Payer: Self-pay | Admitting: *Deleted

## 2022-01-13 VITALS — Ht 62.0 in | Wt 162.0 lb

## 2022-01-13 DIAGNOSIS — R195 Other fecal abnormalities: Secondary | ICD-10-CM

## 2022-01-13 MED ORDER — NA SULFATE-K SULFATE-MG SULF 17.5-3.13-1.6 GM/177ML PO SOLN: 1.0000 | Freq: Once | ORAL | 0 refills | Status: AC

## 2022-01-13 NOTE — Progress Notes (Signed)
No egg or soy allergy known to patient  ?No issues known to pt with past sedation with any surgeries or procedures ?Patient denies ever being told they had issues or difficulty with intubation  ?No FH of Malignant Hyperthermia ?Pt is not on diet pills ?Pt is not on  home 02  ?Pt is not on blood thinners  ?Pt denies issues with constipation   ?No A fib or A flutter ? ? NO PA's for preps discussed with pt In PV today  ?Discussed with pt there will be an out-of-pocket cost for prep and that varies from $0 to 70 +  dollars - pt verbalized understanding  ? ?Due to the COVID-19 pandemic we are asking patients to follow certain guidelines in PV and the [place]   ?Pt aware of COVID protocols and [facility] guidelines  ? ?PV completed over the phone. Pt verified name, DOB, address and insurance during PV today.  ?Pt mailed instruction packet with copy of consent form to read and not return, and instructions.  ?Pt encouraged to call with questions or issues.  ?If pt has My chart, procedure instructions sent via My Chart  ? ?

## 2022-01-25 ENCOUNTER — Encounter: Payer: Self-pay | Admitting: Gastroenterology

## 2022-02-03 ENCOUNTER — Encounter: Payer: Self-pay | Admitting: Gastroenterology

## 2022-03-03 ENCOUNTER — Ambulatory Visit (AMBULATORY_SURGERY_CENTER): Payer: 59 | Admitting: Gastroenterology

## 2022-03-03 ENCOUNTER — Encounter: Payer: Self-pay | Admitting: Gastroenterology

## 2022-03-03 VITALS — BP 120/58 | HR 68 | Temp 96.6°F | Resp 10 | Ht 62.0 in | Wt 162.0 lb

## 2022-03-03 DIAGNOSIS — D124 Benign neoplasm of descending colon: Secondary | ICD-10-CM | POA: Diagnosis not present

## 2022-03-03 DIAGNOSIS — D128 Benign neoplasm of rectum: Secondary | ICD-10-CM

## 2022-03-03 DIAGNOSIS — D123 Benign neoplasm of transverse colon: Secondary | ICD-10-CM | POA: Diagnosis not present

## 2022-03-03 DIAGNOSIS — C187 Malignant neoplasm of sigmoid colon: Secondary | ICD-10-CM

## 2022-03-03 DIAGNOSIS — R195 Other fecal abnormalities: Secondary | ICD-10-CM | POA: Diagnosis not present

## 2022-03-03 DIAGNOSIS — K6389 Other specified diseases of intestine: Secondary | ICD-10-CM

## 2022-03-03 HISTORY — PX: COLONOSCOPY: SHX174

## 2022-03-03 MED ORDER — SODIUM CHLORIDE 0.9 % IV SOLN: 500.0000 mL | Freq: Once | INTRAVENOUS | Status: DC

## 2022-03-03 NOTE — Progress Notes (Signed)
Per Dr. Candis Schatz, pt does not need to have blood work today.  Do not send bottles of contrast yet either.  He wants to get the pathology results first.    No problems noted in the recovery room. maw

## 2022-03-03 NOTE — Progress Notes (Signed)
Called to room to assist during endoscopic procedure.  Patient ID and intended procedure confirmed with present staff. Received instructions for my participation in the procedure from the performing physician.  

## 2022-03-03 NOTE — Patient Instructions (Addendum)
Handout was given to your care partner on polyps. You may resume your current medications today. Await biopsy results.  Biopsies of Sigmoid Mass were sent [facility].  This usually takes a few days to receive the results.  Dr. Candis Schatz will call you with the results. Further recommendations will be based on biopsies of sigmoid mass. Please call if any questions or concerns.     YOU HAD AN ENDOSCOPIC PROCEDURE TODAY AT [facility]:   Refer to the procedure report that was given to you for any specific questions about what was found during the examination.  If the procedure report does not answer your questions, please call your gastroenterologist to clarify.  If you requested that your care partner not be given the details of your procedure findings, then the procedure report has been included in a sealed envelope for you to review at your convenience later.  YOU SHOULD EXPECT: Some feelings of bloating in the abdomen. Passage of more gas than usual.  Walking can help get rid of the air that was put into your GI tract during the procedure and reduce the bloating. If you had a lower endoscopy (such as a colonoscopy or flexible sigmoidoscopy) you may notice spotting of blood in your stool or on the toilet paper. If you underwent a bowel prep for your procedure, you may not have a normal bowel movement for a few days.  Please Note:  You might notice some irritation and congestion in your nose or some drainage.  This is from the oxygen used during your procedure.  There is no need for concern and it should clear up in a day or so.  SYMPTOMS TO REPORT IMMEDIATELY:  Following lower endoscopy (colonoscopy or flexible sigmoidoscopy):  Excessive amounts of blood in the stool  Significant tenderness or worsening of abdominal pains  Swelling of the abdomen that is new, acute  Fever of 100F or higher    For urgent or emergent issues, a gastroenterologist can be reached at any hour by calling  [phone]. Do not use MyChart messaging for urgent concerns.    DIET:  We do recommend a small meal at first, but then you may proceed to your regular diet.  Drink plenty of fluids but you should avoid alcoholic beverages for 24 hours.  ACTIVITY:  You should plan to take it easy for the rest of today and you should NOT DRIVE or use heavy machinery until tomorrow (because of the sedation medicines used during the test).    FOLLOW UP: Our staff will call the number listed on your records 48-72 hours following your procedure to check on you and address any questions or concerns that you may have regarding the information given to you following your procedure. If we do not reach you, we will leave a message.  We will attempt to reach you two times.  During this call, we will ask if you have developed any symptoms of COVID 19. If you develop any symptoms (ie: fever, flu-like symptoms, shortness of breath, cough etc.) before then, please call [phone].  If you test positive for Covid 19 in the 2 weeks post procedure, please call and report this information to [place].    If any biopsies were taken you will be contacted by phone or by letter within the next 1-3 weeks.  Please call us at [phone] if you have not heard about the biopsies in 3 weeks.    SIGNATURES/CONFIDENTIALITY: You and/or your care partner have  signed paperwork which will be entered into your electronic medical record.  These signatures attest to the fact that that the information above on your After Visit Summary has been reviewed and is understood.  Full responsibility of the confidentiality of this discharge information lies with you and/or your care-partner.

## 2022-03-03 NOTE — Progress Notes (Signed)
Pt's states no medical or surgical changes since previsit or office visit. 

## 2022-03-03 NOTE — Op Note (Signed)
[facility] Patient Name: Jade Alvarez Procedure Date: 03/03/2022 1:20 PM MRN: [id number] Endoscopist: Nicki Reaper E. Candis Schatz , MD Age: 61 Referring MD:  Date of Birth: 16-Jun-1961 Gender: Female Account #: [id number] Procedure:                Colonoscopy Indications:              Positive Cologuard test Medicines:                Monitored Anesthesia Care Procedure:                Pre-Anesthesia Assessment:                           - Prior to the procedure, a History and Physical                            was performed, and patient medications and                            allergies were reviewed. The patient's tolerance of                            previous anesthesia was also reviewed. The risks                            and benefits of the procedure and the sedation                            options and risks were discussed with the patient.                            All questions were answered, and informed consent                            was obtained. Prior Anticoagulants: The patient has                            taken no previous anticoagulant or antiplatelet                            agents. ASA Grade Assessment: II - A patient with                            mild systemic disease. After reviewing the risks                            and benefits, the patient was deemed in                            satisfactory condition to undergo the procedure.                           After obtaining informed consent, the colonoscope  was passed under direct vision. Throughout the                            procedure, the patient's blood pressure, pulse, and                            oxygen saturations were monitored continuously. The                            CF [id number] #[id number] was introduced through the anus                            and advanced to the the terminal ileum, with                            identification of the appendiceal  orifice and IC                            valve. The colonoscopy was performed without                            difficulty. The patient tolerated the procedure                            well. The quality of the bowel preparation was                            adequate. The terminal ileum, ileocecal valve,                            appendiceal orifice, and rectum were photographed.                            The bowel preparation used was SUPREP via split                            dose instruction. Scope In: 1:37:59 PM Scope Out: 2:13:02 PM Scope Withdrawal Time: 0 hours 27 minutes 56 seconds  Total Procedure Duration: 0 hours 35 minutes 3 seconds  Findings:                 The perianal and digital rectal examinations were                            normal. Pertinent negatives include normal                            sphincter tone and no palpable rectal lesions.                           A polypoid partially obstructing large mass was                            found in the proximal sigmoid colon. The mass was  non-circumferential. In addition, its diameter                            measured thirty mm. No bleeding was present.                            Biopsies were taken with a cold forceps for                            histology. Estimated blood loss was minimal. Area                            was tattooed with an injection of 2 mL of Spot                            (carbon black). Estimated blood loss was minimal.                           Two sessile polyps were found in the transverse                            colon. The polyps were 4 to 7 mm in size. These                            polyps were removed with a cold snare. Resection                            and retrieval were complete. Estimated blood loss                            was minimal.                           A 4 mm polyp was found in the hepatic flexure. The                             polyp was semi-pedunculated. The polyp was removed                            with a cold snare. Resection and retrieval were                            complete. Estimated blood loss was minimal.                           A 4 mm polyp was found in the splenic flexure. The                            polyp was sessile. The polyp was removed with a                            cold snare. Resection and retrieval were complete.  Estimated blood loss was minimal.                           A 3 mm polyp was found in the descending colon. The                            polyp was sessile. The polyp was removed with a                            cold snare. Resection and retrieval were complete.                            Estimated blood loss was minimal.                           A 2 mm polyp was found in the rectum. The polyp was                            sessile. The polyp was removed with a cold snare.                            Resection and retrieval were complete. Estimated                            blood loss was minimal.                           The exam was otherwise normal throughout the                            examined colon.                           The terminal ileum appeared normal.                           The retroflexed view of the distal rectum and anal                            verge was normal and showed no anal or rectal                            abnormalities. Complications:            No immediate complications. Estimated Blood Loss:     Estimated blood loss was minimal. Impression:               - Rule out malignancy, partially obstructing tumor                            in the proximal sigmoid colon. Biopsied. Tattooed.                           - Two 4 to 7 mm polyps in the transverse colon,  removed with a cold snare. Resected and retrieved.                           - One 4 mm polyp at the hepatic flexure,  removed                            with a cold snare. Resected and retrieved.                           - One 4 mm polyp at the splenic flexure, removed                            with a cold snare. Resected and retrieved.                           - One 3 mm polyp in the descending colon, removed                            with a cold snare. Resected and retrieved.                           - One 2 mm polyp in the rectum, removed with a cold                            snare. Resected and retrieved.                           - The examined portion of the ileum was normal.                           - The distal rectum and anal verge are normal on                            retroflexion view. Recommendation:           - Patient has a contact number available for                            emergencies. The signs and symptoms of potential                            delayed complications were discussed with the                            patient. Return to normal activities tomorrow.                            Written discharge instructions were provided to the                            patient.                           - Resume previous diet.                           -  Continue present medications.                           - Await pathology results.                           - Further recommendations will be based on biopsies                            of sigmoid mass. Shahan Starks E. Candis Schatz, MD 03/03/2022 2:22:49 PM This report has been signed electronically.

## 2022-03-03 NOTE — Progress Notes (Signed)
A and O x3. Report to RN. Tolerated MAC anesthesia well. 

## 2022-03-03 NOTE — Progress Notes (Signed)
[facility] History and Physical   Primary Care Physician:  Denita Lung, MD   Reason for Procedure:   Positive Cologuard  Plan:    Colonoscopy     HPI: Jade Alvarez is a 61 y.o. female undergoing colonoscopy due to positive Cologuard test in Sept 2022.  She has no family history of colon cancer and no chronic GI symptoms.    Past Medical History:  Diagnosis Date   Anemia    PAST HX - NO ISSUES SINCE PARTIAL HYSTERECTOMY   Blood transfusion without reported diagnosis    PRIOR TO HYSTERECTOMY   Hypertension    Positive colorectal cancer screening using Cologuard test 06/24/2021    Past Surgical History:  Procedure Laterality Date   ABDOMINAL HYSTERECTOMY     PARTIAL   CESAREAN SECTION     x2   COLONOSCOPY  03/03/2022   WISDOM TOOTH EXTRACTION      Prior to Admission medications   Medication Sig Start Date End Date Taking? Authorizing Provider  Azilsartan-Chlorthalidone (EDARBYCLOR) 40-25 MG TABS Take 1 tablet by mouth daily. 05/13/21  Yes Denita Lung, MD  naproxen sodium (ALEVE) 220 MG tablet Take 220 mg by mouth daily.    [provider]  senna (SENOKOT) 8.6 MG tablet Take 1 tablet by mouth daily as needed for constipation. Patient not taking: Reported on 01/13/2022    [provider]    Current Outpatient Medications  Medication Sig Dispense Refill   Azilsartan-Chlorthalidone (EDARBYCLOR) 40-25 MG TABS Take 1 tablet by mouth daily. 90 tablet 3   naproxen sodium (ALEVE) 220 MG tablet Take 220 mg by mouth daily.     senna (SENOKOT) 8.6 MG tablet Take 1 tablet by mouth daily as needed for constipation. (Patient not taking: Reported on 01/13/2022)     Current Facility-Administered Medications  Medication Dose Route Frequency Provider Last Rate Last Admin   0.9 %  sodium chloride infusion  500 mL Intravenous Once Daryel November, MD        Allergies as of 03/03/2022 - Review Complete 03/03/2022  Allergen Reaction Noted    Penicillins  12/06/2011    Family History  Problem Relation Age of Onset   Colon cancer Neg Hx    Colon polyps Neg Hx    Esophageal cancer Neg Hx    Rectal cancer Neg Hx    Stomach cancer Neg Hx     Social History   Socioeconomic History   Marital status: Married    Spouse name: Not on file   Number of children: Not on file   Years of education: Not on file   Highest education level: Not on file  Occupational History   Not on file  Tobacco Use   Smoking status: Never   Smokeless tobacco: Never  Vaping Use   Vaping Use: Never used  Substance and Sexual Activity   Alcohol use: Never   Drug use: Never   Sexual activity: Yes    Birth control/protection: Post-menopausal, Surgical  Other Topics Concern   Not on file  Social History Narrative   Not on file   Social Determinants of Health   Financial Resource Strain: Not on file  Food Insecurity: Not on file  Transportation Needs: Not on file  Physical Activity: Not on file  Stress: Not on file  Social Connections: Not on file  Intimate Partner Violence: Not on file    Review of Systems:  All other review of systems negative except as mentioned in  the HPI.  Physical Exam: Vital signs BP 130/72   Pulse 78   Temp (!) 96.6 F (35.9 C) (Temporal)   Ht '5\' 2"'$  (1.575 m)   Wt 162 lb (73.5 kg)   SpO2 100%   BMI 29.63 kg/m   General:   Alert,  Well-developed, well-nourished, pleasant and cooperative in NAD Airway:  Mallampati 2 Lungs:  Clear throughout to auscultation.   Heart:  Regular rate and rhythm; no murmurs, clicks, rubs,  or gallops. Abdomen:  Soft, nontender and nondistended. Normal bowel sounds.   Neuro/Psych:  Normal mood and affect. A and O x 3   Konnor Vondrasek E. Candis Schatz, MD [facility]

## 2022-03-06 ENCOUNTER — Telehealth: Payer: Self-pay

## 2022-03-06 NOTE — Telephone Encounter (Signed)
First attempt follow up call to pt, no answer. 

## 2022-03-08 ENCOUNTER — Other Ambulatory Visit: Payer: Self-pay

## 2022-03-08 DIAGNOSIS — C189 Malignant neoplasm of colon, unspecified: Secondary | ICD-10-CM

## 2022-03-08 NOTE — Progress Notes (Signed)
I spoke with Ms. Jade Alvarez today about her pathology results which confirmed my suspicion for colon cancer.  I informed her that the next step would be to obtain a CT scan of the chest, abdomen and pelvis for staging.  We will place a referral to colorectal surgery to discuss surgery. The patient indicated understanding, and will await a call from radiology to schedule the CT scan and a call from Dr. Orest Dikes office to schedule a clinic appointment.  Vaughan Basta, can you please place the orders for a CT scan (chest, abdomen, pelvis) with IV/oral contrast and a referral to colorectal surgery (Dr. Annye English)?

## 2022-03-09 DIAGNOSIS — C187 Malignant neoplasm of sigmoid colon: Secondary | ICD-10-CM | POA: Diagnosis not present

## 2022-03-17 ENCOUNTER — Ambulatory Visit (HOSPITAL_COMMUNITY)
Admission: RE | Admit: 2022-03-17 | Discharge: 2022-03-17 | Disposition: A | Discharge status: Home / Self Care | Payer: 59 | Source: Ambulatory Visit | Attending: Gastroenterology | Admitting: Gastroenterology

## 2022-03-17 DIAGNOSIS — C189 Malignant neoplasm of colon, unspecified: Secondary | ICD-10-CM | POA: Insufficient documentation

## 2022-03-17 MED ORDER — IOHEXOL 300 MG/ML  SOLN
100.0000 mL | Freq: Once | INTRAMUSCULAR | Status: AC | PRN
  Administered 2022-03-17: 100 mL via INTRAVENOUS

## 2022-04-06 ENCOUNTER — Encounter: Payer: 59 | Admitting: Family Medicine

## 2022-04-14 ENCOUNTER — Other Ambulatory Visit: Payer: Self-pay | Admitting: Family Medicine

## 2022-04-14 DIAGNOSIS — I10 Essential (primary) hypertension: Secondary | ICD-10-CM

## 2022-04-14 NOTE — Progress Notes (Signed)
Sent message, via epic in basket, requesting order in epic from surgeon     04/14/22 1116  Preop Orders  Has preop orders? No  Name of staff/physician contacted for orders(Indicate phone or IB message) Ileana Roup, MD

## 2022-04-17 ENCOUNTER — Ambulatory Visit: Payer: Self-pay | Admitting: Surgery

## 2022-04-17 DIAGNOSIS — Z01818 Encounter for other preprocedural examination: Secondary | ICD-10-CM

## 2022-04-17 DIAGNOSIS — C187 Malignant neoplasm of sigmoid colon: Secondary | ICD-10-CM

## 2022-04-20 NOTE — Patient Instructions (Addendum)
DUE TO COVID-19 ONLY TWO VISITORS  (aged 63 and older)  ARE ALLOWED TO COME WITH YOU AND STAY IN THE WAITING ROOM ONLY DURING PRE OP AND PROCEDURE.   **NO VISITORS ARE ALLOWED IN THE SHORT STAY AREA OR RECOVERY ROOM!!**  IF YOU WILL BE ADMITTED INTO THE HOSPITAL YOU ARE ALLOWED ONLY FOUR SUPPORT PEOPLE DURING VISITATION HOURS ONLY (7 AM -8PM)   The support person(s) must pass our screening, gel in and out, and wear a mask at all times, including in the patient's room. Patients must also wear a mask when staff or their support person are in the room. Visitors GUEST BADGE MUST BE WORN VISIBLY  One adult visitor may remain with you overnight and MUST be in the room by 8 P.M.     Your procedure is scheduled on: 04/28/22   Report to [facility] Main Entrance    Report to admitting at 5:15 AM   Call this number if you have problems the morning of surgery [phone]   Follow a clear liquid diet the day before surgery   You may have the following liquids until 4:30 AM DAY OF SURGERY  Water Black Coffee (sugar ok, NO MILK/CREAM OR CREAMERS)  Tea (sugar ok, NO MILK/CREAM OR CREAMERS) regular and decaf                             Plain Jell-O (NO RED)                                           Fruit ices (not with fruit pulp, NO RED)                                     Popsicles (NO RED)                                                                  Juice: apple, WHITE grape, WHITE cranberry Sports drinks like Gatorade (NO RED) Clear broth(vegetable,chicken,beef)               Drink 2 Ensure drinks AT 10:00 PM the night before surgery.        The day of surgery:  Drink ONE (1) Pre-Surgery Clear Ensure at 4:30 AM the morning of surgery. Drink in one sitting. Do not sip.  This drink was given to you during your hospital  pre-op appointment visit. Nothing else to drink after completing the  Pre-Surgery Clear Ensure.          If you have questions, please contact your  surgeon's office.   FOLLOW BOWEL PREP AND ANY ADDITIONAL PRE OP INSTRUCTIONS YOU RECEIVED FROM YOUR SURGEON'S OFFICE!!!     Oral Hygiene is also important to reduce your risk of infection.                                    Remember - BRUSH YOUR TEETH THE MORNING OF SURGERY WITH YOUR  REGULAR TOOTHPASTE   Take these medicines the morning of surgery with A SIP OF WATER: None                              You may not have any metal on your body including hair pins, jewelry, and body piercing             Do not wear make-up, lotions, powders, perfumes, or deodorant  Do not wear nail polish including gel and S&S, artificial/acrylic nails, or any other type of covering on natural nails including finger and toenails. If you have artificial nails, gel coating, etc. that needs to be removed by a nail salon please have this removed prior to surgery or surgery may need to be canceled/ delayed if the surgeon/ anesthesia feels like they are unable to be safely monitored.   Do not shave  48 hours prior to surgery.    Do not bring valuables to the hospital. [place].   Bring small overnight bag day of surgery.   DO NOT [place]. PHARMACY WILL DISPENSE MEDICATIONS LISTED ON YOUR MEDICATION LIST TO YOU DURING YOUR ADMISSION [place]!    Special Instructions: Bring a copy of your healthcare power of attorney and living will documents         the day of surgery if you haven't scanned them before.              Please read over the following fact sheets you were given: IF YOU HAVE QUESTIONS ABOUT YOUR PRE-OP INSTRUCTIONS PLEASE CALL [facility] - Preparing for Surgery Before surgery, you can play an important role.  Because skin is not sterile, your skin needs to be as free of germs as possible.  You can reduce the number of germs on your skin by washing with CHG (chlorahexidine gluconate)  soap before surgery.  CHG is an antiseptic cleaner which kills germs and bonds with the skin to continue killing germs even after washing. Please DO NOT use if you have an allergy to CHG or antibacterial soaps.  If your skin becomes reddened/irritated stop using the CHG and inform your nurse when you arrive at Short Stay. Do not shave (including legs and underarms) for at least 48 hours prior to the first CHG shower.  You may shave your face/neck.  Please follow these instructions carefully:  1.  Shower with CHG Soap the night before surgery and the  morning of surgery.  2.  If you choose to wash your hair, wash your hair first as usual with your normal  shampoo.  3.  After you shampoo, rinse your hair and body thoroughly to remove the shampoo.                             4.  Use CHG as you would any other liquid soap.  You can apply chg directly to the skin and wash.  Gently with a scrungie or clean washcloth.  5.  Apply the CHG Soap to your body ONLY FROM THE NECK DOWN.   Do   not use on face/ open  Wound or open sores. Avoid contact with eyes, ears mouth and   genitals (private parts).                       Wash face,  Genitals (private parts) with your normal soap.             6.  Wash thoroughly, paying special attention to the area where your    surgery  will be performed.  7.  Thoroughly rinse your body with warm water from the neck down.  8.  DO NOT shower/wash with your normal soap after using and rinsing off the CHG Soap.                9.  Pat yourself dry with a clean towel.            10.  Wear clean pajamas.            11.  Place clean sheets on your bed the night of your first shower and do not  sleep with pets. Day of Surgery : Do not apply any lotions/deodorants the morning of surgery.  Please wear clean clothes to the hospital/surgery center.  FAILURE TO FOLLOW THESE INSTRUCTIONS MAY RESULT IN THE CANCELLATION OF YOUR SURGERY  PATIENT  SIGNATURE_________________________________  NURSE SIGNATURE__________________________________  ________________________________________________________________________   Jade Alvarez  An incentive spirometer is a tool that can help keep your lungs clear and active. This tool measures how well you are filling your lungs with each breath. Taking long deep breaths may help reverse or decrease the chance of developing breathing (pulmonary) problems (especially infection) following: A long period of time when you are unable to move or be active. BEFORE THE PROCEDURE  If the spirometer includes an indicator to show your best effort, your nurse or respiratory therapist will set it to a desired goal. If possible, sit up straight or lean slightly forward. Try not to slouch. Hold the incentive spirometer in an upright position. INSTRUCTIONS FOR USE  Sit on the edge of your bed if possible, or sit up as far as you can in bed or on a chair. Hold the incentive spirometer in an upright position. Breathe out normally. Place the mouthpiece in your mouth and seal your lips tightly around it. Breathe in slowly and as deeply as possible, raising the piston or the ball toward the top of the column. Hold your breath for 3-5 seconds or for as long as possible. Allow the piston or ball to fall to the bottom of the column. Remove the mouthpiece from your mouth and breathe out normally. Rest for a few seconds and repeat Steps 1 through 7 at least 10 times every 1-2 hours when you are awake. Take your time and take a few normal breaths between deep breaths. The spirometer may include an indicator to show your best effort. Use the indicator as a goal to work toward during each repetition. After each set of 10 deep breaths, practice coughing to be sure your lungs are clear. If you have an incision (the cut made at the time of surgery), support your incision when coughing by placing a pillow or rolled up towels  firmly against it. Once you are able to get out of bed, walk around indoors and cough well. You may stop using the incentive spirometer when instructed by your caregiver.  RISKS AND COMPLICATIONS Take your time so you do not get dizzy or light-headed. If you are in pain, you  may need to take or ask for pain medication before doing incentive spirometry. It is harder to take a deep breath if you are having pain. AFTER USE Rest and breathe slowly and easily. It can be helpful to keep track of a log of your progress. Your caregiver can provide you with a simple table to help with this. If you are using the spirometer at home, follow these instructions: [place] IF:  You are having difficultly using the spirometer. You have trouble using the spirometer as often as instructed. Your pain medication is not giving enough relief while using the spirometer. You develop fever of 100.5 F (38.1 C) or higher. SEEK IMMEDIATE MEDICAL CARE IF:  You cough up bloody sputum that had not been present before. You develop fever of 102 F (38.9 C) or greater. You develop worsening pain at or near the incision site. MAKE SURE YOU:  Understand these instructions. Will watch your condition. Will get help right away if you are not doing well or get worse. Document Released: 02/12/2007 Document Revised: 12/25/2011 Document Reviewed: 04/15/2007 Exit[employer] Patient Information 2014 [employer], [place].   ________________________________________________________________________  WHAT IS A BLOOD TRANSFUSION? Blood Transfusion Information  A transfusion is the replacement of blood or some of its parts. Blood is made up of multiple cells which provide different functions. Red blood cells carry oxygen and are used for blood loss replacement. White blood cells fight against infection. Platelets control bleeding. Plasma helps clot blood. Other blood products are available for specialized needs, such as hemophilia or  other clotting disorders. BEFORE THE TRANSFUSION  Who gives blood for transfusions?  Healthy volunteers who are fully evaluated to make sure their blood is safe. This is blood bank blood. Transfusion therapy is the safest it has ever been in the practice of medicine. Before blood is taken from a donor, a complete history is taken to make sure that person has no history of diseases nor engages in risky social behavior (examples are intravenous drug use or sexual activity with multiple partners). The donor's travel history is screened to minimize risk of transmitting infections, such as malaria. The donated blood is tested for signs of infectious diseases, such as HIV and hepatitis. The blood is then tested to be sure it is compatible with you in order to minimize the chance of a transfusion reaction. If you or a relative donates blood, this is often done in anticipation of surgery and is not appropriate for emergency situations. It takes many days to process the donated blood. RISKS AND COMPLICATIONS Although transfusion therapy is very safe and saves many lives, the main dangers of transfusion include:  Getting an infectious disease. Developing a transfusion reaction. This is an allergic reaction to something in the blood you were given. Every precaution is taken to prevent this. The decision to have a blood transfusion has been considered carefully by your caregiver before blood is given. Blood is not given unless the benefits outweigh the risks. AFTER THE TRANSFUSION Right after receiving a blood transfusion, you will usually feel much better and more energetic. This is especially true if your red blood cells have gotten low (anemic). The transfusion raises the level of the red blood cells which carry oxygen, and this usually causes an energy increase. The nurse administering the transfusion will monitor you carefully for complications. HOME CARE INSTRUCTIONS  No special instructions are needed after  a transfusion. You may find your energy is better. Speak with your caregiver about any limitations on  activity for underlying diseases you may have. SEEK MEDICAL CARE IF:  Your condition is not improving after your transfusion. You develop redness or irritation at the intravenous (IV) site. SEEK IMMEDIATE MEDICAL CARE IF:  Any of the following symptoms occur over the next 12 hours: Shaking chills. You have a temperature by mouth above 102 F (38.9 C), not controlled by medicine. Chest, back, or muscle pain. People around you feel you are not acting correctly or are confused. Shortness of breath or difficulty breathing. Dizziness and fainting. You get a rash or develop hives. You have a decrease in urine output. Your urine turns a dark color or changes to pink, red, or brown. Any of the following symptoms occur over the next 10 days: You have a temperature by mouth above 102 F (38.9 C), not controlled by medicine. Shortness of breath. Weakness after normal activity. The white part of the eye turns yellow (jaundice). You have a decrease in the amount of urine or are urinating less often. Your urine turns a dark color or changes to pink, red, or brown. Document Released: 09/29/2000 Document Revised: 12/25/2011 Document Reviewed: 05/18/2008 [facility] Patient Information 2014 [place], [place].  _______________________________________________________________________

## 2022-04-20 NOTE — Progress Notes (Addendum)
COVID Vaccine Completed: yes x3  Date of COVID positive in last 90 days: no  PCP - Jill Alexanders, MD Cardiologist - n/a  Chest x-ray - CT 03/17/22 Epic EKG - 04/21/22 Epic/chart Stress Test - n/a ECHO - 09/02/19 Epic Cardiac Cath - n/a Pacemaker/ICD device last checked: n/a Spinal Cord Stimulator: n/a  Bowel Prep - Flagly, Miralax, clears day before  Sleep Study - n/a CPAP -   Fasting Blood Sugar - n/a Checks Blood Sugar _____ times a day  Blood Thinner Instructions: n/a Aspirin Instructions: Last Dose:  Activity level:  Can go up a flight of stairs and perform activities of daily living without stopping and without symptoms of chest pain or shortness of breath.  Able to exercise without symptoms  Unable to go up a flight of stairs without symptoms of     Anesthesia review: EKG abnormality   Patient denies shortness of breath, fever, cough and chest pain at PAT appointment  Patient verbalized understanding of instructions that were given to them at the PAT appointment. Patient was also instructed that they will need to review over the PAT instructions again at home before surgery.

## 2022-04-21 ENCOUNTER — Encounter (HOSPITAL_COMMUNITY)
Admission: RE | Admit: 2022-04-21 | Discharge: 2022-04-21 | Disposition: A | Discharge status: Home / Self Care | Payer: 59 | Source: Ambulatory Visit | Attending: Surgery | Admitting: Surgery

## 2022-04-21 ENCOUNTER — Encounter (HOSPITAL_COMMUNITY): Payer: Self-pay

## 2022-04-21 VITALS — BP 116/77 | HR 69 | Temp 98.2°F | Resp 12 | Ht 62.0 in | Wt 158.0 lb

## 2022-04-21 DIAGNOSIS — Z01818 Encounter for other preprocedural examination: Secondary | ICD-10-CM | POA: Insufficient documentation

## 2022-04-21 DIAGNOSIS — I10 Essential (primary) hypertension: Secondary | ICD-10-CM | POA: Diagnosis not present

## 2022-04-21 DIAGNOSIS — C187 Malignant neoplasm of sigmoid colon: Secondary | ICD-10-CM | POA: Diagnosis not present

## 2022-04-21 LAB — COMPREHENSIVE METABOLIC PANEL
ALT: 19 U/L (ref 0–44)
AST: 19 U/L (ref 15–41)
Albumin: 4.2 g/dL (ref 3.5–5.0)
Alkaline Phosphatase: 52 U/L (ref 38–126)
Anion gap: 10 (ref 5–15)
BUN: 20 mg/dL (ref 8–23)
CO2: 27 mmol/L (ref 22–32)
Calcium: 9.1 mg/dL (ref 8.9–10.3)
Chloride: 99 mmol/L (ref 98–111)
Creatinine, Ser: 0.99 mg/dL (ref 0.44–1.00)
GFR, Estimated: >60 mL/min (ref >60)
Glucose, Bld: 91 mg/dL (ref 70–99)
Potassium: 3.6 mmol/L (ref 3.5–5.1)
Sodium: 136 mmol/L (ref 135–145)
Total Bilirubin: 0.6 mg/dL (ref 0.3–1.2)
Total Protein: 8.1 g/dL (ref 6.5–8.1)

## 2022-04-21 LAB — CBC WITH DIFFERENTIAL/PLATELET
Abs Immature Granulocytes: 0.02 10*3/uL (ref 0.00–0.07)
Basophils Absolute: 0.1 10*3/uL (ref 0.0–0.1)
Basophils Relative: 1 %
Eosinophils Absolute: 0.3 10*3/uL (ref 0.0–0.5)
Eosinophils Relative: 3 %
HCT: 41.7 % (ref 36.0–46.0)
Hemoglobin: 13.3 g/dL (ref 12.0–15.0)
Immature Granulocytes: 0 %
Lymphocytes Relative: 29 %
Lymphs Abs: 2.2 10*3/uL (ref 0.7–4.0)
MCH: 29.5 pg (ref 26.0–34.0)
MCHC: 31.9 g/dL (ref 30.0–36.0)
MCV: 92.5 fL (ref 80.0–100.0)
Monocytes Absolute: 0.8 10*3/uL (ref 0.1–1.0)
Monocytes Relative: 11 %
Neutro Abs: 4.3 10*3/uL (ref 1.7–7.7)
Neutrophils Relative %: 56 %
Platelets: 408 10*3/uL — ABNORMAL HIGH (ref 150–400)
RBC: 4.51 MIL/uL (ref 3.87–5.11)
RDW: 13.4 % (ref 11.5–15.5)
WBC: 7.7 10*3/uL (ref 4.0–10.5)
nRBC: 0 % (ref 0.0–0.2)

## 2022-04-21 LAB — HEMOGLOBIN A1C
Hgb A1c MFr Bld: 6.3 % — ABNORMAL HIGH (ref 4.8–5.6)
Mean Plasma Glucose: 134.11 mg/dL

## 2022-04-27 ENCOUNTER — Encounter (HOSPITAL_COMMUNITY): Payer: Self-pay | Admitting: Surgery

## 2022-04-27 NOTE — Anesthesia Preprocedure Evaluation (Addendum)
Anesthesia Evaluation  Patient identified by MRN, date of birth, ID band Patient awake    Reviewed: Allergy & Precautions, NPO status , Patient's Chart, lab work & pertinent test results  Airway Mallampati: II  TM Distance: >3 FB Neck ROM: Full    Dental  (+) Dental Advisory Given, Partial Lower, Partial Upper, Missing   Pulmonary neg pulmonary ROS,    Pulmonary exam normal breath sounds clear to auscultation       Cardiovascular hypertension, Pt. on medications Normal cardiovascular exam Rhythm:Regular Rate:Normal     Neuro/Psych negative neurological ROS     GI/Hepatic negative GI ROS, Neg liver ROS,   Endo/Other  negative endocrine ROS  Renal/GU negative Renal ROS     Musculoskeletal negative musculoskeletal ROS (+)   Abdominal   Peds  Hematology  (+) Blood dyscrasia, anemia ,   Anesthesia Other Findings   Reproductive/Obstetrics negative OB ROS                            Anesthesia Physical Anesthesia Plan  ASA: 2  Anesthesia Plan: General   Post-op Pain Management: Tylenol PO (pre-op)*, Celebrex PO (pre-op)* and Gabapentin PO (pre-op)*   Induction: Intravenous  PONV Risk Score and Plan: 4 or greater and Ondansetron, Dexamethasone, Midazolam, Treatment may vary due to age or medical condition and Scopolamine patch - Pre-op  Airway Management Planned: Oral ETT  Additional Equipment:   Intra-op Plan:   Post-operative Plan: Extubation in OR  Informed Consent: I have reviewed the patients History and Physical, chart, labs and discussed the procedure including the risks, benefits and alternatives for the proposed anesthesia with the patient or authorized representative who has indicated his/her understanding and acceptance.     Dental advisory given  Plan Discussed with: CRNA  Anesthesia Plan Comments:       Anesthesia Quick Evaluation

## 2022-04-28 ENCOUNTER — Inpatient Hospital Stay (HOSPITAL_COMMUNITY): Payer: 59 | Admitting: Physician Assistant

## 2022-04-28 ENCOUNTER — Other Ambulatory Visit: Payer: Self-pay

## 2022-04-28 ENCOUNTER — Encounter: Payer: 59 | Admitting: Family Medicine

## 2022-04-28 ENCOUNTER — Inpatient Hospital Stay (HOSPITAL_COMMUNITY)
Admission: RE | Admit: 2022-04-28 | Discharge: 2022-04-30 | DRG: 331 | Disposition: A | Discharge status: Home / Self Care | Payer: 59 | Source: Ambulatory Visit | Attending: Surgery | Admitting: Surgery

## 2022-04-28 ENCOUNTER — Encounter (HOSPITAL_COMMUNITY)
Admission: RE | Disposition: A | Discharge status: Home / Self Care | Payer: Self-pay | Source: Ambulatory Visit | Attending: Surgery

## 2022-04-28 ENCOUNTER — Inpatient Hospital Stay (HOSPITAL_COMMUNITY): Payer: 59 | Admitting: Anesthesiology

## 2022-04-28 ENCOUNTER — Encounter (HOSPITAL_COMMUNITY): Payer: Self-pay | Admitting: Surgery

## 2022-04-28 DIAGNOSIS — C189 Malignant neoplasm of colon, unspecified: Secondary | ICD-10-CM | POA: Insufficient documentation

## 2022-04-28 DIAGNOSIS — K660 Peritoneal adhesions (postprocedural) (postinfection): Secondary | ICD-10-CM | POA: Diagnosis present

## 2022-04-28 DIAGNOSIS — I10 Essential (primary) hypertension: Secondary | ICD-10-CM | POA: Diagnosis present

## 2022-04-28 DIAGNOSIS — Z880 Allergy status to penicillin: Secondary | ICD-10-CM

## 2022-04-28 DIAGNOSIS — C187 Malignant neoplasm of sigmoid colon: Secondary | ICD-10-CM | POA: Diagnosis not present

## 2022-04-28 DIAGNOSIS — Z9049 Acquired absence of other specified parts of digestive tract: Principal | ICD-10-CM

## 2022-04-28 DIAGNOSIS — Z90711 Acquired absence of uterus with remaining cervical stump: Secondary | ICD-10-CM

## 2022-04-28 HISTORY — DX: Malignant neoplasm of colon, unspecified: C18.9

## 2022-04-28 HISTORY — PX: FLEXIBLE SIGMOIDOSCOPY: SHX5431

## 2022-04-28 HISTORY — PX: LAPAROSCOPIC COLON RESECTION W/ SPLENIC TAKEDOWN: SUR792

## 2022-04-28 LAB — TYPE AND SCREEN
ABO/RH(D): A POS
Antibody Screen: NEGATIVE

## 2022-04-28 SURGERY — COLECTOMY, SIGMOID, ROBOT-ASSISTED
Anesthesia: General

## 2022-04-28 MED ORDER — BUPIVACAINE-EPINEPHRINE (PF) 0.25% -1:200000 IJ SOLN
INTRAMUSCULAR | Status: AC
  Filled 2022-04-28: qty 30

## 2022-04-28 MED ORDER — SPY AGENT GREEN - (INDOCYANINE FOR INJECTION)
INTRAMUSCULAR | Status: DC | PRN
  Administered 2022-04-28: 1 mL via INTRAVENOUS

## 2022-04-28 MED ORDER — ALUM & MAG HYDROXIDE-SIMETH 200-200-20 MG/5ML PO SUSP: 30.0000 mL | Freq: Four times a day (QID) | ORAL | Status: DC | PRN

## 2022-04-28 MED ORDER — TRAMADOL HCL 50 MG PO TABS: 50.0000 mg | ORAL_TABLET | Freq: Four times a day (QID) | ORAL | 0 refills | Status: AC | PRN

## 2022-04-28 MED ORDER — ALVIMOPAN 12 MG PO CAPS: 12.0000 mg | ORAL_CAPSULE | ORAL | Status: AC

## 2022-04-28 MED ORDER — ROCURONIUM BROMIDE 10 MG/ML (PF) SYRINGE
PREFILLED_SYRINGE | INTRAVENOUS | Status: AC
Start: 2022-04-28 — End: ?
  Filled 2022-04-28: qty 30

## 2022-04-28 MED ORDER — DEXAMETHASONE SODIUM PHOSPHATE 10 MG/ML IJ SOLN
INTRAMUSCULAR | Status: AC
  Filled 2022-04-28: qty 3

## 2022-04-28 MED ORDER — LIDOCAINE 2% (20 MG/ML) 5 ML SYRINGE
INTRAMUSCULAR | Status: DC | PRN
  Administered 2022-04-28: 80 mg via INTRAVENOUS

## 2022-04-28 MED ORDER — EPHEDRINE SULFATE-NACL 50-0.9 MG/10ML-% IV SOSY
PREFILLED_SYRINGE | INTRAVENOUS | Status: DC | PRN
  Administered 2022-04-28: 7.5 mg via INTRAVENOUS
  Administered 2022-04-28: 5 mg via INTRAVENOUS
  Administered 2022-04-28: 7.5 mg via INTRAVENOUS

## 2022-04-28 MED ORDER — KETAMINE HCL 10 MG/ML IJ SOLN
INTRAMUSCULAR | Status: DC | PRN
  Administered 2022-04-28: 20 mg via INTRAVENOUS
  Administered 2022-04-28: 10 mg via INTRAVENOUS

## 2022-04-28 MED ORDER — LACTATED RINGERS IV SOLN: INTRAVENOUS | Status: DC

## 2022-04-28 MED ORDER — HEPARIN SODIUM (PORCINE) 5000 UNIT/ML IJ SOLN
5000.0000 [IU] | Freq: Once | INTRAMUSCULAR | Status: AC
  Administered 2022-04-28: 5000 [IU] via SUBCUTANEOUS
  Filled 2022-04-28: qty 1

## 2022-04-28 MED ORDER — ENSURE PRE-SURGERY PO LIQD
592.0000 mL | Freq: Once | ORAL | Status: DC
  Filled 2022-04-28: qty 592

## 2022-04-28 MED ORDER — ALVIMOPAN 12 MG PO CAPS
ORAL_CAPSULE | ORAL | Status: AC
  Administered 2022-04-28: 12 mg via ORAL
  Filled 2022-04-28: qty 1

## 2022-04-28 MED ORDER — CHLORHEXIDINE GLUCONATE 0.12 % MT SOLN
15.0000 mL | Freq: Once | OROMUCOSAL | Status: AC
  Administered 2022-04-28: 15 mL via OROMUCOSAL

## 2022-04-28 MED ORDER — POLYETHYLENE GLYCOL 3350 17 GM/SCOOP PO POWD: 1.0000 | Freq: Once | ORAL | Status: DC

## 2022-04-28 MED ORDER — ORAL CARE MOUTH RINSE: 15.0000 mL | Freq: Once | OROMUCOSAL | Status: AC

## 2022-04-28 MED ORDER — ONDANSETRON HCL 4 MG/2ML IJ SOLN
INTRAMUSCULAR | Status: DC | PRN
  Administered 2022-04-28: 4 mg via INTRAVENOUS

## 2022-04-28 MED ORDER — TRAMADOL HCL 50 MG PO TABS: 50.0000 mg | ORAL_TABLET | Freq: Four times a day (QID) | ORAL | Status: DC | PRN

## 2022-04-28 MED ORDER — METRONIDAZOLE 500 MG PO TABS: 1000.0000 mg | ORAL_TABLET | ORAL | Status: DC

## 2022-04-28 MED ORDER — FENTANYL CITRATE (PF) 100 MCG/2ML IJ SOLN
INTRAMUSCULAR | Status: AC
  Filled 2022-04-28: qty 2

## 2022-04-28 MED ORDER — BUPIVACAINE-EPINEPHRINE (PF) 0.25% -1:200000 IJ SOLN
INTRAMUSCULAR | Status: DC | PRN
  Administered 2022-04-28: 30 mL

## 2022-04-28 MED ORDER — HYDROMORPHONE HCL 1 MG/ML IJ SOLN: 0.5000 mg | INTRAMUSCULAR | Status: DC | PRN

## 2022-04-28 MED ORDER — CHLORTHALIDONE 25 MG PO TABS
25.0000 mg | ORAL_TABLET | Freq: Every day | ORAL | Status: DC
  Administered 2022-04-29: 25 mg via ORAL
  Filled 2022-04-28: qty 1

## 2022-04-28 MED ORDER — ALVIMOPAN 12 MG PO CAPS
12.0000 mg | ORAL_CAPSULE | Freq: Two times a day (BID) | ORAL | Status: DC
  Filled 2022-04-28: qty 1

## 2022-04-28 MED ORDER — FENTANYL CITRATE (PF) 100 MCG/2ML IJ SOLN
INTRAMUSCULAR | Status: DC | PRN
  Administered 2022-04-28: 100 ug via INTRAVENOUS

## 2022-04-28 MED ORDER — PROPOFOL 10 MG/ML IV BOLUS
INTRAVENOUS | Status: DC | PRN
  Administered 2022-04-28: 120 mg via INTRAVENOUS

## 2022-04-28 MED ORDER — ENSURE PRE-SURGERY PO LIQD
296.0000 mL | Freq: Once | ORAL | Status: DC
  Filled 2022-04-28: qty 296

## 2022-04-28 MED ORDER — LIDOCAINE HCL (PF) 2 % IJ SOLN
INTRAMUSCULAR | Status: AC
Start: 2022-04-28 — End: ?
  Filled 2022-04-28: qty 15

## 2022-04-28 MED ORDER — ONDANSETRON HCL 4 MG PO TABS: 4.0000 mg | ORAL_TABLET | Freq: Four times a day (QID) | ORAL | Status: DC | PRN

## 2022-04-28 MED ORDER — BUPIVACAINE LIPOSOME 1.3 % IJ SUSP
INTRAMUSCULAR | Status: DC | PRN
  Administered 2022-04-28: 20 mL

## 2022-04-28 MED ORDER — 0.9 % SODIUM CHLORIDE (POUR BTL) OPTIME
TOPICAL | Status: DC | PRN
  Administered 2022-04-28: 2000 mL

## 2022-04-28 MED ORDER — MIDAZOLAM HCL 5 MG/5ML IJ SOLN
INTRAMUSCULAR | Status: DC | PRN
  Administered 2022-04-28: 2 mg via INTRAVENOUS

## 2022-04-28 MED ORDER — SIMETHICONE 80 MG PO CHEW
40.0000 mg | CHEWABLE_TABLET | Freq: Four times a day (QID) | ORAL | Status: DC | PRN
  Administered 2022-04-28: 40 mg via ORAL
  Filled 2022-04-28: qty 1

## 2022-04-28 MED ORDER — DIPHENHYDRAMINE HCL 12.5 MG/5ML PO ELIX: 12.5000 mg | ORAL_SOLUTION | Freq: Four times a day (QID) | ORAL | Status: DC | PRN

## 2022-04-28 MED ORDER — PHENYLEPHRINE 80 MCG/ML (10ML) SYRINGE FOR IV PUSH (FOR BLOOD PRESSURE SUPPORT)
PREFILLED_SYRINGE | INTRAVENOUS | Status: AC
  Filled 2022-04-28: qty 10

## 2022-04-28 MED ORDER — ENSURE SURGERY PO LIQD: 237.0000 mL | Freq: Two times a day (BID) | ORAL | Status: DC

## 2022-04-28 MED ORDER — EPHEDRINE 5 MG/ML INJ
INTRAVENOUS | Status: AC
  Filled 2022-04-28: qty 5

## 2022-04-28 MED ORDER — IBUPROFEN 400 MG PO TABS: 600.0000 mg | ORAL_TABLET | Freq: Four times a day (QID) | ORAL | Status: DC | PRN

## 2022-04-28 MED ORDER — KETAMINE HCL 50 MG/5ML IJ SOSY
PREFILLED_SYRINGE | INTRAMUSCULAR | Status: AC
  Filled 2022-04-28: qty 5

## 2022-04-28 MED ORDER — SODIUM CHLORIDE 0.9 % IV SOLN
1.0000 g | INTRAVENOUS | Status: AC
  Administered 2022-04-28: 1 g via INTRAVENOUS
  Filled 2022-04-28: qty 1

## 2022-04-28 MED ORDER — ROCURONIUM BROMIDE 10 MG/ML (PF) SYRINGE
PREFILLED_SYRINGE | INTRAVENOUS | Status: DC | PRN
  Administered 2022-04-28 (×2): 10 mg via INTRAVENOUS
  Administered 2022-04-28: 70 mg via INTRAVENOUS
  Administered 2022-04-28: 20 mg via INTRAVENOUS

## 2022-04-28 MED ORDER — ACETAMINOPHEN 500 MG PO TABS
1000.0000 mg | ORAL_TABLET | Freq: Four times a day (QID) | ORAL | Status: DC
  Administered 2022-04-28 – 2022-04-30 (×7): 1000 mg via ORAL
  Filled 2022-04-28 (×7): qty 2

## 2022-04-28 MED ORDER — BISACODYL 5 MG PO TBEC: 20.0000 mg | DELAYED_RELEASE_TABLET | Freq: Once | ORAL | Status: DC

## 2022-04-28 MED ORDER — BUPIVACAINE LIPOSOME 1.3 % IJ SUSP: 20.0000 mL | Freq: Once | INTRAMUSCULAR | Status: DC

## 2022-04-28 MED ORDER — ONDANSETRON HCL 4 MG/2ML IJ SOLN: 4.0000 mg | Freq: Four times a day (QID) | INTRAMUSCULAR | Status: DC | PRN

## 2022-04-28 MED ORDER — HYDRALAZINE HCL 20 MG/ML IJ SOLN: 10.0000 mg | INTRAMUSCULAR | Status: DC | PRN

## 2022-04-28 MED ORDER — MIDAZOLAM HCL 2 MG/2ML IJ SOLN
INTRAMUSCULAR | Status: AC
  Filled 2022-04-28: qty 2

## 2022-04-28 MED ORDER — ACETAMINOPHEN 500 MG PO TABS: 1000.0000 mg | ORAL_TABLET | ORAL | Status: AC

## 2022-04-28 MED ORDER — HEPARIN SODIUM (PORCINE) 5000 UNIT/ML IJ SOLN
5000.0000 [IU] | Freq: Three times a day (TID) | INTRAMUSCULAR | Status: DC
  Administered 2022-04-28 – 2022-04-30 (×5): 5000 [IU] via SUBCUTANEOUS
  Filled 2022-04-28 (×5): qty 1

## 2022-04-28 MED ORDER — IRBESARTAN 150 MG PO TABS
150.0000 mg | ORAL_TABLET | Freq: Every day | ORAL | Status: DC
  Administered 2022-04-29: 150 mg via ORAL
  Filled 2022-04-28: qty 1

## 2022-04-28 MED ORDER — ONDANSETRON HCL 4 MG/2ML IJ SOLN
INTRAMUSCULAR | Status: AC
  Filled 2022-04-28: qty 6

## 2022-04-28 MED ORDER — LACTATED RINGERS IV SOLN: INTRAVENOUS | Status: DC | PRN

## 2022-04-28 MED ORDER — CHLORHEXIDINE GLUCONATE CLOTH 2 % EX PADS: 6.0000 | MEDICATED_PAD | Freq: Once | CUTANEOUS | Status: DC

## 2022-04-28 MED ORDER — PHENYLEPHRINE 80 MCG/ML (10ML) SYRINGE FOR IV PUSH (FOR BLOOD PRESSURE SUPPORT)
PREFILLED_SYRINGE | INTRAVENOUS | Status: DC | PRN
  Administered 2022-04-28: 80 ug via INTRAVENOUS
  Administered 2022-04-28: 160 ug via INTRAVENOUS

## 2022-04-28 MED ORDER — SUGAMMADEX SODIUM 200 MG/2ML IV SOLN
INTRAVENOUS | Status: DC | PRN
  Administered 2022-04-28: 200 mg via INTRAVENOUS

## 2022-04-28 MED ORDER — PROPOFOL 10 MG/ML IV BOLUS
INTRAVENOUS | Status: AC
Start: 2022-04-28 — End: ?
  Filled 2022-04-28: qty 20

## 2022-04-28 MED ORDER — STERILE WATER FOR IRRIGATION IR SOLN
Status: DC | PRN
  Administered 2022-04-28: 500 mL

## 2022-04-28 MED ORDER — HYDROMORPHONE HCL 1 MG/ML IJ SOLN: 0.2500 mg | INTRAMUSCULAR | Status: DC | PRN

## 2022-04-28 MED ORDER — ACETAMINOPHEN 500 MG PO TABS
ORAL_TABLET | ORAL | Status: AC
  Administered 2022-04-28: 1000 mg via ORAL
  Filled 2022-04-28: qty 2

## 2022-04-28 MED ORDER — BUPIVACAINE LIPOSOME 1.3 % IJ SUSP
INTRAMUSCULAR | Status: AC
  Filled 2022-04-28: qty 20

## 2022-04-28 MED ORDER — LACTATED RINGERS IR SOLN
Status: DC | PRN
  Administered 2022-04-28: 1000 mL

## 2022-04-28 MED ORDER — NEOMYCIN SULFATE 500 MG PO TABS: 1000.0000 mg | ORAL_TABLET | ORAL | Status: DC

## 2022-04-28 MED ORDER — AZILSARTAN-CHLORTHALIDONE 40-25 MG PO TABS: 1.0000 | ORAL_TABLET | Freq: Every day | ORAL | Status: DC

## 2022-04-28 MED ORDER — DIPHENHYDRAMINE HCL 50 MG/ML IJ SOLN: 12.5000 mg | Freq: Four times a day (QID) | INTRAMUSCULAR | Status: DC | PRN

## 2022-04-28 MED ORDER — DEXAMETHASONE SODIUM PHOSPHATE 10 MG/ML IJ SOLN
INTRAMUSCULAR | Status: DC | PRN
  Administered 2022-04-28: 10 mg via INTRAVENOUS

## 2022-04-28 SURGICAL SUPPLY — 121 items
APPLIER CLIP 5 13 M/L LIGAMAX5 (MISCELLANEOUS)
APPLIER CLIP ROT 10 11.4 M/L (STAPLE)
BAG COUNTER SPONGE SURGICOUNT (BAG) IMPLANT
BLADE EXTENDED COATED 6.5IN (ELECTRODE) ×2 IMPLANT
CANNULA REDUC XI 12-8 STAPL (CANNULA) ×2
CANNULA REDUCER 12-8 DVNC XI (CANNULA) ×1 IMPLANT
CELLS DAT CNTRL 66122 CELL SVR (MISCELLANEOUS) IMPLANT
CHLORAPREP W/TINT 26 (MISCELLANEOUS) ×2 IMPLANT
CLIP APPLIE 5 13 M/L LIGAMAX5 (MISCELLANEOUS) IMPLANT
CLIP APPLIE ROT 10 11.4 M/L (STAPLE) IMPLANT
CLIP LIGATING HEM O LOK PURPLE (MISCELLANEOUS) IMPLANT
CLIP LIGATING HEMO O LOK GREEN (MISCELLANEOUS) IMPLANT
COVER SURGICAL LIGHT HANDLE (MISCELLANEOUS) ×4 IMPLANT
COVER TIP SHEARS 8 DVNC (MISCELLANEOUS) ×1 IMPLANT
COVER TIP SHEARS 8MM DA VINCI (MISCELLANEOUS) ×2
DEFOGGER SCOPE WARMER CLEARIFY (MISCELLANEOUS) ×2 IMPLANT
DERMABOND ADVANCED (GAUZE/BANDAGES/DRESSINGS) ×1
DERMABOND ADVANCED .7 DNX12 (GAUZE/BANDAGES/DRESSINGS) IMPLANT
DEVICE TROCAR PUNCTURE CLOSURE (ENDOMECHANICALS) IMPLANT
DRAIN CHANNEL 19F RND (DRAIN) ×1 IMPLANT
DRAPE ARM DVNC X/XI (DISPOSABLE) ×4 IMPLANT
DRAPE COLUMN DVNC XI (DISPOSABLE) ×1 IMPLANT
DRAPE DA VINCI XI ARM (DISPOSABLE) ×8
DRAPE DA VINCI XI COLUMN (DISPOSABLE) ×2
DRAPE SURG IRRIG POUCH 19X23 (DRAPES) ×2 IMPLANT
DRSG OPSITE POSTOP 4X10 (GAUZE/BANDAGES/DRESSINGS) IMPLANT
DRSG OPSITE POSTOP 4X6 (GAUZE/BANDAGES/DRESSINGS) IMPLANT
DRSG OPSITE POSTOP 4X8 (GAUZE/BANDAGES/DRESSINGS) IMPLANT
DRSG TEGADERM 2-3/8X2-3/4 SM (GAUZE/BANDAGES/DRESSINGS) ×10 IMPLANT
DRSG TEGADERM 4X4.75 (GAUZE/BANDAGES/DRESSINGS) ×2 IMPLANT
ELECT REM PT RETURN 15FT ADLT (MISCELLANEOUS) ×2 IMPLANT
ENDOLOOP SUT PDS II  0 18 (SUTURE)
ENDOLOOP SUT PDS II 0 18 (SUTURE) IMPLANT
EVACUATOR SILICONE 100CC (DRAIN) ×2 IMPLANT
GAUZE SPONGE 2X2 8PLY STRL LF (GAUZE/BANDAGES/DRESSINGS) ×1 IMPLANT
GAUZE SPONGE 4X4 12PLY STRL (GAUZE/BANDAGES/DRESSINGS) IMPLANT
GLOVE BIO SURGEON STRL SZ7.5 (GLOVE) ×6 IMPLANT
GLOVE INDICATOR 8.0 STRL GRN (GLOVE) ×6 IMPLANT
GOWN SRG XL LVL 4 BRTHBL STRL (GOWNS) ×1 IMPLANT
GOWN STRL NON-REIN XL LVL4 (GOWNS) ×2
GOWN STRL REUS W/ TWL XL LVL3 (GOWN DISPOSABLE) ×5 IMPLANT
GOWN STRL REUS W/TWL XL LVL3 (GOWN DISPOSABLE) ×10
GRASPER SUT TROCAR 14GX15 (MISCELLANEOUS) IMPLANT
HOLDER FOLEY CATH W/STRAP (MISCELLANEOUS) ×2 IMPLANT
IRRIG SUCT STRYKERFLOW 2 WTIP (MISCELLANEOUS) ×2
IRRIGATION SUCT STRKRFLW 2 WTP (MISCELLANEOUS) ×1 IMPLANT
KIT PROCEDURE DA VINCI SI (MISCELLANEOUS) ×2
KIT PROCEDURE DVNC SI (MISCELLANEOUS) IMPLANT
KIT TURNOVER KIT A (KITS) IMPLANT
NDL INSUFFLATION 14GA 120MM (NEEDLE) ×1 IMPLANT
NEEDLE INSUFFLATION 14GA 120MM (NEEDLE) ×2 IMPLANT
PACK CARDIOVASCULAR III (CUSTOM PROCEDURE TRAY) ×2 IMPLANT
PACK COLON (CUSTOM PROCEDURE TRAY) ×2 IMPLANT
PAD POSITIONING PINK XL (MISCELLANEOUS) ×2 IMPLANT
PENCIL SMOKE EVACUATOR (MISCELLANEOUS) IMPLANT
PROTECTOR NERVE ULNAR (MISCELLANEOUS) ×4 IMPLANT
RELOAD STAPLE 45 3.5 BLU DVNC (STAPLE) IMPLANT
RELOAD STAPLE 45 4.3 GRN DVNC (STAPLE) IMPLANT
RELOAD STAPLE 60 3.5 BLU DVNC (STAPLE) IMPLANT
RELOAD STAPLE 60 4.3 GRN DVNC (STAPLE) IMPLANT
RELOAD STAPLER 3.5X45 BLU DVNC (STAPLE) IMPLANT
RELOAD STAPLER 3.5X60 BLU DVNC (STAPLE) IMPLANT
RELOAD STAPLER 4.3X45 GRN DVNC (STAPLE) IMPLANT
RELOAD STAPLER 4.3X60 GRN DVNC (STAPLE) ×2 IMPLANT
RETRACTOR WND ALEXIS 18 MED (MISCELLANEOUS) IMPLANT
RTRCTR WOUND ALEXIS 18CM MED (MISCELLANEOUS)
SCISSORS LAP 5X35 DISP (ENDOMECHANICALS) IMPLANT
SEAL CANN UNIV 5-8 DVNC XI (MISCELLANEOUS) ×4 IMPLANT
SEAL XI 5MM-8MM UNIVERSAL (MISCELLANEOUS) ×8
SEALER TISSUE G2 STRG ARTC 35C (ENDOMECHANICALS) ×1 IMPLANT
SEALER VESSEL DA VINCI XI (MISCELLANEOUS) ×2
SEALER VESSEL EXT DVNC XI (MISCELLANEOUS) ×1 IMPLANT
SLEEVE ADV FIXATION 5X100MM (TROCAR) IMPLANT
SOLUTION ELECTROLUBE (MISCELLANEOUS) ×2 IMPLANT
SPIKE FLUID TRANSFER (MISCELLANEOUS) ×2 IMPLANT
SPONGE GAUZE 2X2 STER 10/PKG (GAUZE/BANDAGES/DRESSINGS) ×1
STAPLER 60 DA VINCI SURE FORM (STAPLE) ×2
STAPLER 60 SUREFORM DVNC (STAPLE) IMPLANT
STAPLER CANNULA SEAL DVNC XI (STAPLE) ×1 IMPLANT
STAPLER CANNULA SEAL XI (STAPLE) ×2
STAPLER ECHELON POWER CIR 29 (STAPLE) ×1 IMPLANT
STAPLER ECHELON POWER CIR 31 (STAPLE) IMPLANT
STAPLER RELOAD 3.5X45 BLU DVNC (STAPLE)
STAPLER RELOAD 3.5X45 BLUE (STAPLE)
STAPLER RELOAD 3.5X60 BLU DVNC (STAPLE)
STAPLER RELOAD 3.5X60 BLUE (STAPLE)
STAPLER RELOAD 4.3X45 GREEN (STAPLE)
STAPLER RELOAD 4.3X45 GRN DVNC (STAPLE)
STAPLER RELOAD 4.3X60 GREEN (STAPLE) ×4
STAPLER RELOAD 4.3X60 GRN DVNC (STAPLE) ×2
STOPCOCK 4 WAY LG BORE MALE ST (IV SETS) ×4 IMPLANT
SURGILUBE 2OZ TUBE FLIPTOP (MISCELLANEOUS) ×2 IMPLANT
SUT MNCRL AB 4-0 PS2 18 (SUTURE) ×4 IMPLANT
SUT PDS AB 1 CT1 27 (SUTURE) ×2 IMPLANT
SUT PDS AB 1 TP1 96 (SUTURE) IMPLANT
SUT PROLENE 0 CT 2 (SUTURE) IMPLANT
SUT PROLENE 2 0 KS (SUTURE) ×2 IMPLANT
SUT PROLENE 2 0 SH DA (SUTURE) IMPLANT
SUT SILK 2 0 (SUTURE)
SUT SILK 2 0 SH CR/8 (SUTURE) IMPLANT
SUT SILK 2-0 18XBRD TIE 12 (SUTURE) IMPLANT
SUT SILK 3 0 (SUTURE) ×2
SUT SILK 3 0 SH CR/8 (SUTURE) ×3 IMPLANT
SUT SILK 3-0 18XBRD TIE 12 (SUTURE) ×1 IMPLANT
SUT V-LOC BARB 180 2/0GR6 GS22 (SUTURE)
SUT VIC AB 3-0 SH 18 (SUTURE) IMPLANT
SUT VIC AB 3-0 SH 27 (SUTURE)
SUT VIC AB 3-0 SH 27XBRD (SUTURE) IMPLANT
SUT VICRYL 0 UR6 27IN ABS (SUTURE) ×2 IMPLANT
SUTURE V-LC BRB 180 2/0GR6GS22 (SUTURE) IMPLANT
SYR 10ML LL (SYRINGE) ×2 IMPLANT
SYS LAPSCP GELPORT 120MM (MISCELLANEOUS)
SYS WOUND ALEXIS 18CM MED (MISCELLANEOUS) ×2
SYSTEM LAPSCP GELPORT 120MM (MISCELLANEOUS) IMPLANT
SYSTEM WOUND ALEXIS 18CM MED (MISCELLANEOUS) ×1 IMPLANT
TAPE UMBILICAL 1/8 X36 TWILL (MISCELLANEOUS) ×2 IMPLANT
TOWEL OR NON WOVEN STRL DISP B (DISPOSABLE) ×2 IMPLANT
TRAY FOLEY MTR SLVR 16FR STAT (SET/KITS/TRAYS/PACK) ×2 IMPLANT
TROCAR ADV FIXATION 5X100MM (TROCAR) ×2 IMPLANT
TUBING CONNECTING 10 (TUBING) ×4 IMPLANT
TUBING INSUFFLATION 10FT LAP (TUBING) ×2 IMPLANT

## 2022-04-28 NOTE — H&P (Signed)
CC: Here today for surgery  HPI: Jade Alvarez is an 61 y.o. female with history of HTN, whom is seen in the office today as a referral by Dr. Candis Alvarez for newly diagnosed sigmoid colon cancer.  She underwent colonoscopy 03/03/2022 with Dr. Candis Alvarez. This demonstrated: 1. Mass in the proximal sigmoid colon was biopsied and tattooed -biopsies returned invasive adenocarcinoma, moderately differentiated. 2. 6 diminutive polyps are all removed and returned as tubular adenomas. No dysplasia. 3. Normal terminal ileum.  Scheduled for CT chest/abdomen/pelvis for staging purposes 03/17/2022.  She reports that she had a positive Cologuard test that triggered her colonoscopy. This was her first colonoscopy. Her brother recently passed and she did note a small amount of blood in her stool on the day that he had passed. She has had none since or prior to. No abdominal pain, nausea, vomiting, diarrhea, or constipation. No abdominal bloating.  She denies any changes in health or health history since we met in the office. Tolerated bowel prep with satisfactory result.   PMH: HTN  PSH: Partial hysterectomy via pfannenstiel; 2 prior c-sx. No other abdominal surgical hx   FHx: Denies any known family history of colorectal, breast, endometrial or ovarian cancer  Social Hx: Denies use of tobacco/EtOH/illicit drug. Works as Glass blower/designer at [employer]  Past Medical History:  Diagnosis Date   Anemia    PAST HX - NO ISSUES SINCE PARTIAL HYSTERECTOMY   Blood transfusion without reported diagnosis    PRIOR TO HYSTERECTOMY   Hypertension    Positive colorectal cancer screening using Cologuard test 06/24/2021    Past Surgical History:  Procedure Laterality Date   ABDOMINAL HYSTERECTOMY     PARTIAL   CESAREAN SECTION     x2   COLONOSCOPY  03/03/2022   WISDOM TOOTH EXTRACTION      Family History  Problem Relation Age of Onset   Colon cancer Neg Hx    Colon polyps Neg Hx     Esophageal cancer Neg Hx    Rectal cancer Neg Hx    Stomach cancer Neg Hx     Social:  reports that she has never smoked. She has never used smokeless tobacco. She reports that she does not drink alcohol and does not use drugs.  Allergies:  Allergies  Allergen Reactions   Penicillins Hives and Shortness Of Breath    Did it involve swelling of the face/tongue/throat, SOB, or low BP?No Did it involve sudden or severe rash/hives, skin peeling, or any reaction on the inside of your mouth or nose?Yes Did you need to seek medical attention at a hospital or doctor's office? No When did it last happen? 61yr old  If all above answers are "NO", may proceed with cephalosporin use.    Medications: I have reviewed the patient's current medications.  No results found for this or any previous visit (from the past 48 hour(s)).  No results found.  ROS - all of the below systems have been reviewed with the patient and positives are indicated with bold text General: chills, fever or night sweats Eyes: blurry vision or double vision ENT: epistaxis or sore throat Allergy/Immunology: itchy/watery eyes or nasal congestion Hematologic/Lymphatic: bleeding problems, blood clots or swollen lymph nodes Endocrine: temperature intolerance or unexpected weight changes Breast: new or changing breast lumps or nipple discharge Resp: cough, shortness of breath, or wheezing CV: chest pain or dyspnea on exertion GI: as per HPI GU: dysuria, trouble voiding, or hematuria MSK: joint pain or joint  stiffness Neuro: TIA or stroke symptoms Derm: pruritus and skin lesion changes Psych: anxiety and depression  PE Blood pressure 129/80, pulse 88, temperature 99 F (37.2 C), temperature source Oral, resp. rate 16, SpO2 97 %. Constitutional: NAD; conversant Eyes: Moist conjunctiva; no lid lag Neck: Trachea midline; no thyromegaly Lungs: Normal respiratory effort; no tactile fremitus CV: RRR; no palpable thrills; no  pitting edema GI: Abd soft, NT/ND; no palpable hepatosplenomegaly MSK: Normal range of motion of extremities Psychiatric: Appropriate affect; alert and oriented x3  No results found for this or any previous visit (from the past 48 hour(s)).  No results found.   A/P: Jade Alvarez is an 61 y.o. female with hx of HTN here for evaluation of newly diagnosed presumed proximal sigmoid colon moderately differentiated adenocarcinoma. Biopsied and tattooed.  Alb 4.6 (10/28/21)  CT chest/abdomen/pelvis 03/18/22 - no evidence of metastatic disease  -The anatomy and physiology of the GI tract was reviewed with the patient. The pathophysiology of colon polyps and cancer was discussed as well with associated pictures. -We have discussed various different treatment options going forward including surgery (the most definitive) to address this -robotic assisted sigmoidectomy versus low anterior resection, flexible sigmoidoscopy, intraoperative assessment of perfusion (indocyanine green). -The planned procedures, material risks (including, but not limited to, pain, bleeding, infection, scarring, need for blood transfusion, damage to surrounding structures- blood vessels/nerves/viscus/organs, damage to ureter, urine leak, leak from anastomosis, need for additional procedures, scenarios where a stoma may be necessary and where it may be permanent, worsening of pre-existing medical conditions, hernia, recurrence, pneumonia, heart attack, stroke, death) benefits and alternatives to surgery were discussed at length. The patient's questions were answered to her satisfaction, she voiced understanding and elected to proceed with surgery. Additionally, we discussed typical postoperative expectations and the recovery process.  Jade Alvarez, [facility], [place]

## 2022-04-28 NOTE — Transfer of Care (Signed)
Immediate Anesthesia Transfer of Care Note  Patient: Jade Alvarez  Procedure(s) Performed: Procedure(s): XI ROBOT ASSISTED LEFT HEMI COLECTOMY, TAKEDOWN OF SPLENIC FLEXURE (N/A) INTRAOPERATIVE ASSESSMENT OF PERFUSION ICG (N/A) FLEXIBLE SIGMOIDOSCOPY (N/A)  Patient Location: PACU  Anesthesia Type:General  Level of Consciousness:  sedated, patient cooperative and responds to stimulation  Airway & Oxygen Therapy:Patient Spontanous Breathing and Patient connected to face mask oxgen  Post-op Assessment:  Report given to PACU RN and Post -op Vital signs reviewed and stable  Post vital signs:  Reviewed and stable  Last Vitals:  Vitals:   04/28/22 0537  BP: 129/80  Pulse: 88  Resp: 16  Temp: 37.2 C  SpO2: 73%    Complications: No apparent anesthesia complications

## 2022-04-28 NOTE — Progress Notes (Signed)
On arrival to floor patient ambulating to bed, observed with pacu nurse and tech, blood dripping to floor assited to bed and noted that a port site with dermabond was oozing blood, K Phillips PACU RN, saw Margie Billet PA for [facility] in hall, dermabond was obtained and B Meuth PA applied to port site in abdomen. Bethann Punches RN

## 2022-04-28 NOTE — Op Note (Signed)
PATIENT: Jade Alvarez  61 y.o. female  Patient Care Team: Denita Lung, MD as PCP - General (Family Medicine)  PREOP DIAGNOSIS: SIGMOID COLON CANCER  POSTOP DIAGNOSIS: SIGMOID COLON CANCER  PROCEDURE:  Robotic assisted left hemicolectomy Takedown of splenic flexure Robotic lysis of adhesions x 30 minutes Intraoperative assessment of perfusion using ICG fluorescence imaging Flexible sigmoidoscopy Bilateral transversus abdominus plane (TAP) blocks  SURGEON: Dustie Mt. Sabine Tenenbaum, MD  ASSISTANT: Leighton Ruff, MD  ANESTHESIA: General endotracheal  EBL: 100 mL Total I/O In: 800 [I.V.:800] Out: 300 [Urine:200; Blood:100]  DRAINS: None  SPECIMEN: Left colon - open end proximal  COUNTS: Sponge, needle and instrument counts were reported correct x2  FINDINGS: No evident metastatic disease on visceral parietal peritoneum or liver. Tattoo in descending colon. This necessitated a left hemicolectomy to remove the cancer. A well perfused, tension free, hemostatic, air tight 29 mm EEA colorectal anastomosis fashioned 17 cm from the anal verge by flexible sigmoidoscopy.   NARRATIVE: Informed consent was verified. The patient was taken to the operating room, placed supine on the operating table and SCD's were applied. General endotracheal anesthesia was induced without difficulty. She was then positioned in the lithotomy position with Allen stirrups.  Pressure points were evaluated and padded.  A foley catheter was then placed by nursing under sterile conditions. Hair on the abdomen was clipped.  She was secured to the operating table. The abdomen was then prepped and draped in the standard sterile fashion. Surgical timeout was called indicating the correct patient, procedure, positioning and need for preoperative antibiotics.   An OG tube was placed by anesthesia and confirmed to be to suction.  At Palmer's point, a stab incision was created and the Veress needle was introduced into  the peritoneal cavity on the first attempt.  Intraperitoneal location was confirmed by the aspiration and saline drop test.  Pneumoperitoneum was established to a maximum pressure of 15 mmHg using CO2.  Following this, the abdomen was marked for planned trocar sites.  Just to the right and cephalad to the umbilicus, an 8 mm incision was created and an 8 mm blunt tipped robotic trocar was cautiously placed into the peritoneal cavity.  The laparoscope was inserted and demonstrated no evidence of trocar site nor Veress needle site complications.  The Veress needle was removed.  Bilateral transversus abdominis plane blocks were then created using a dilute mixture of Exparel with Marcaine.  3 additional 8 mm robotic trochars were placed under direct visualization roughly in a line extending from the right ASIS towards the left upper quadrant. The bladder was inspected and noted to be at/below the pubic symphysis.  Staying 3 fingerbreadths above the pubic symphysis, an incision was created and the 12 mm robotic trocar inserted directed cephalad into the peritoneal cavity under direct visualization.  An additional 5 mm assist port was placed in the right lateral abdomen under direct visualization.  The abdomen was surveyed and there was adhesions across the low midline consisting of omentum as well as one loop of small bowel.  The peritoneal surfaces are normal in appearance.  The surface of the liver is normal in appearance. She was positioned in Trendelenburg with the left side tilted slightly up.  Small bowel was carefully retracted out of the pelvis.  The robot was then docked and I went to the console.  We began with adhesiolysis. Adhesions consisting of omentum were taken down first followed by the small bowel.  This was all done using scissors in  a sharp manner.  Her small bowel was relatively incorporated at her prior Pfannenstiel site.  The serosa is grossly intact.  At the conclusion of the case, however, we  did imbricate this region using 3-0 silk sutures in a Lembert fashion, just prior to fascial closure.   The sigmoid colon was readily identified.  A tattoo was evident in the distal descending colon with a mass proximal to this.  Attachments of the sigmoid colon were taken down from the intersigmoid fossa.  The rectosigmoid colon was grasped and elevated anteriorly.  Beginning with a medial to lateral approach, the peritoneum overlying the presacral space was carefully incised.  The TME plane was readily gained working in a plane between the fascia propria of the rectum and the presacral fascia.  Hypogastric nerves were seen going along the the presacral fascia and were protected free of injury.  Working more proximally, the mesorectum and sigmoid mesentery were carefully mobilized off of the peritoneum.  The left ureter was identified and protected free of injury.  The left gonadal vessels were identified and protected.  These were both swept "down."  The superior hemorrhoidal and IMA pedicles were identified. Further mesocolon was mobilized proximally staying in this plane between the retroperitoneum proper and the mesocolon. Attention was then turned to the lateral portion of dissection.  The sigmoid colon was then retracted to the right.  The sigmoid colon was fully mobilized. The descending colon was mobilized by incising the Sheridan Hew line of Toldt.  This was done all the way up to the level of the splenic flexure.  The associated mesocolon was also mobilized medially.  The left ureter again was confirmed to be well away from the vasculature which had been dissected medially.  The rectosigmoid colon was elevated anteriorly. The left ureter was re-identified. The IMA was clear of this. The IMA was then divided with the vessel sealer. The stump was inspected and noted to be completely hemostatic with a good seal.  We ensured that there was complete mobility medially working all the way up to the distal edge of  the pancreas.  For length purposes, we did have to ligate the IMV and this was done using the vessel sealer.  The stump was inspected and intact/hemostatic.  The mesentery was divided out to the point of planned proximal division was divided with the vessel sealer.  This goes to 5 cm proximal to the mass.  Working more distally, the rectum was identified where the tinea had splayed and there were loss of appendices epiploica.  This also corresponded to a location overlying the sacral promontory.  Anatomically, this clearly represents the proximal rectum.  The mesentery out to this level was then cleared using the vessel sealer. The distal point of transection on the proximal rectum was identified.   A 60 mm green load robotic stapler was then placed through the 12 mm port and introduced into the peritoneal cavity.  The rectum was divided with 2 firings of the stapler.  The stump was intact and healthy in appearance.   In order to facilitate reach of the proximal edge of planned transection, it was apparent that we would need to mobilize the splenic flexure.  She has a relatively high splenic flexure.  We began laterally mobilizing the splenic flexure by taking down the splenocolic ligament.  The associated mesentery was reflected.  We worked to mobilize the omentum off of the distal transverse colon as well.   Attention was turned to performing a  perfusion test. ICG was administered by anesthesia and at the level of the cleared mesentery proximally, there was excellent uptake of the tracer.  The rectum was also well perfused in appearance.  There was a visible pulse in the mesentery out to the level of the cleared colon at the level of the proximal sigmoid/descending colon junction.  This colon is also supple and healthy in appearance without any thickening.  This reached into the pelvis without any difficulty and remained in that location without any tension. A locking grasper was then placed on the sigmoid  staple line.   Attention was turned to the extracorporeal portion of the procedure.  The robot was undocked.  I scrubbed back in.  Using the 12 mm trocar site, a Pfannenstiel incision was created and incorporated the fascial opening through the 12 mm port site.  The rectus fascia was incised and then elevated.  The rectus muscle was mobilized free of the overlying fascia.  The peritoneum was incised in the midline well above the location of the bladder.  An [place] wound protector was placed.  Towels were placed around the field.  The divided colon was passed through the wound protector.  It was apparent this point that the mass is slightly more proximal to our tattoo then was appreciated.  We therefore converted to a laparoscopic procedure and the wound protector cap was replaced.  Using a laparoscopic Enseal device, further attachments of the distal transverse colon were mobilized such that the splenic flexure reaches down into her pelvis.  We then delivered the specimen through the wound protector and there was sufficient reach of the planned proximal division point into her pelvis.  The point of proximal division was identified and was again on a healthy segment of supple colon with a palpable pulse in the mesentery. This was pink in color.  A pursestring device was applied.  A 2-0 Prolene on a Keith needle was passed.  The colon was divided and passed off with the open end being proximal.  EEA sizers were then introduced and a 29 mm EEA selected.  "Belt loops" consisting of 3-0 silk were placed around the pursestring suture line.  The anvil was placed and the pursestring tied.  A small amount of fat was cleared from the planned anastomosis and no diverticula were apparent within this.  This was placed back into the abdomen and a cap placed over the wound protector port site.  Pneumoperitoneum was reestablished.  I then went below to pass the stapler.  My partner remained above.  EEA sizers were cautiously  introduced via the anus and advanced under direct visualization.  The stapler was passed and the spike deployed just anterior to the staple line.  The components were then mated.  Orientation was confirmed such that there is no twisting of the colon nor small bowel underneath the mesenteric defect. Care was taken to ensure no other structures were incorporated within this either.  The stapler was then closed, held, and fired. This was then removed. The donuts were inspected and noted to be complete.  The colon proximal to the anastomosis was then gently occluded. The pelvis was filled with sterile irrigation. Under direct visualization, I passed a flexible sigmoidoscope.  The anastomosis was under water.  With good distention of the anastomosis there was no air leak. The anastomosis pink in appearance.  This is located at 17 cm from the anal verge by flexible sigmoidoscopy.  It is hemostatic.  Additionally, looking from above,  there is no tension on the colon or mesentery.  Sigmoidoscope was withdrawn.  Irrigation was evacuated from the pelvis.  The abdomen and pelvis are surveyed and noted to be completely hemostatic without any apparent injury.  Under direct visualization, all trochars are removed.  The [place] wound protector was removed.  Gowns/gloves are changed and a fresh set of clean instruments utilized. Additional sterile drapes were placed around the field.   The Pfannenstiel peritoneum was closed with a running 2-0 Vicryl suture.  The rectus fascia was then closed using 2 running #1 PDS sutures.  The fascia was then palpated and noted to be completely closed.  Additional anesthetic was infiltrated at the Pfannenstiel site.  Sponge, needle, and instrument counts were reported correct x2. 4-0 Monocryl subcuticular suture was used to close the skin of all incision sites.  Dermabond was placed over all incisions.  A honeycomb dressing placed over the Pfannenstiel as well.   She was then taken out of  lithotomy, awakened from anesthesia, extubated, and transferred to a stretcher for transport to PACU in satisfactory condition having tolerated the procedure well.

## 2022-04-28 NOTE — Anesthesia Procedure Notes (Signed)
Procedure Name: Intubation Date/Time: 04/28/2022 7:45 AM  Performed by: Lavina Hamman, CRNAPre-anesthesia Checklist: Patient identified, Emergency Drugs available, Suction available, Patient being monitored and Timeout performed Patient Re-evaluated:Patient Re-evaluated prior to induction Oxygen Delivery Method: Circle system utilized Preoxygenation: Pre-oxygenation with 100% oxygen Induction Type: IV induction Ventilation: Mask ventilation without difficulty Laryngoscope Size: Mac and 3 Grade View: Grade I Tube type: Oral Tube size: 7.0 mm Number of attempts: 1 Airway Equipment and Method: Stylet Placement Confirmation: ETT inserted through vocal cords under direct vision, positive ETCO2, CO2 detector and breath sounds checked- equal and bilateral Secured at: 21 cm Tube secured with: Tape Dental Injury: Teeth and Oropharynx as per pre-operative assessment  Comments: ATOI

## 2022-04-29 LAB — BASIC METABOLIC PANEL
Anion gap: 8 (ref 5–15)
BUN: 14 mg/dL (ref 8–23)
CO2: 31 mmol/L (ref 22–32)
Calcium: 8.2 mg/dL — ABNORMAL LOW (ref 8.9–10.3)
Chloride: 101 mmol/L (ref 98–111)
Creatinine, Ser: 1.07 mg/dL — ABNORMAL HIGH (ref 0.44–1.00)
GFR, Estimated: 58 mL/min — ABNORMAL LOW (ref >60)
Glucose, Bld: 103 mg/dL — ABNORMAL HIGH (ref 70–99)
Potassium: 3.1 mmol/L — ABNORMAL LOW (ref 3.5–5.1)
Sodium: 140 mmol/L (ref 135–145)

## 2022-04-29 LAB — CBC
HCT: 31.5 % — ABNORMAL LOW (ref 36.0–46.0)
Hemoglobin: 10.2 g/dL — ABNORMAL LOW (ref 12.0–15.0)
MCH: 30 pg (ref 26.0–34.0)
MCHC: 32.4 g/dL (ref 30.0–36.0)
MCV: 92.6 fL (ref 80.0–100.0)
Platelets: 287 10*3/uL (ref 150–400)
RBC: 3.4 MIL/uL — ABNORMAL LOW (ref 3.87–5.11)
RDW: 13.5 % (ref 11.5–15.5)
WBC: 9.8 10*3/uL (ref 4.0–10.5)
nRBC: 0 % (ref 0.0–0.2)

## 2022-04-29 MED ORDER — POTASSIUM CHLORIDE 20 MEQ PO PACK
40.0000 meq | PACK | Freq: Once | ORAL | Status: AC
  Administered 2022-04-29: 40 meq via ORAL
  Filled 2022-04-29: qty 2

## 2022-04-29 NOTE — Progress Notes (Signed)
1 Day Post-Op   Subjective/Chief Complaint: Doing great, having flatus, tol clears, walking no pain   Objective: Vital signs in last 24 hours: Temp:  [98 F (36.7 C)-98.8 F (37.1 C)] 98.4 F (36.9 C) (07/15 0500) Pulse Rate:  [77-91] 77 (07/15 0500) Resp:  [12-19] 18 (07/15 0500) BP: (106-139)/(67-78) 120/67 (07/15 0500) SpO2:  [92 %-100 %] 99 % (07/15 0500) Weight:  [72.8 kg] 72.8 kg (07/15 0455) Last BM Date : 04/27/22  Intake/Output from previous day: 07/14 0701 - 07/15 0700 In: 3432.9 [P.O.:540; I.V.:2892.9] Out: 1850 [Urine:1750; Blood:100] Intake/Output this shift: Total I/O In: -  Out: 200 [Urine:200]  General nad Pulm effort normal Ab soft nontender incisoins clean  Lab Results:  Recent Labs    04/29/22 0437  WBC 9.8  HGB 10.2*  HCT 31.5*  PLT 287   BMET Recent Labs    04/29/22 0437  NA 140  K 3.1*  CL 101  CO2 31  GLUCOSE 103*  BUN 14  CREATININE 1.07*  CALCIUM 8.2*   PT/INR No results for input(s): "LABPROT", "INR" in the last 72 hours. ABG No results for input(s): "PHART", "HCO3" in the last 72 hours.  Invalid input(s): "PCO2", "PO2"  Studies/Results: No results found.  Anti-infectives: Anti-infectives (From admission, onward)    Start     Dose/Rate Route Frequency Ordered Stop   04/28/22 1400  neomycin (MYCIFRADIN) tablet 1,000 mg  Status:  Discontinued       See Hyperspace for full Linked Orders Report.   1,000 mg Oral 3 times per day 04/28/22 0530 04/28/22 0538   04/28/22 1400  metroNIDAZOLE (FLAGYL) tablet 1,000 mg  Status:  Discontinued       See Hyperspace for full Linked Orders Report.   1,000 mg Oral 3 times per day 04/28/22 0530 04/28/22 0538   04/28/22 0600  ertapenem (INVANZ) 1,000 mg in sodium chloride 0.9 % 100 mL IVPB        1 g 200 mL/hr over 30 Minutes Intravenous On call to O.R. 04/28/22 0530 04/28/22 0750       Assessment/Plan: POD 1 robot left colon -soft diet -pulm toilet -replace K -possibly home  tomorrow    Rolm Bookbinder 04/29/2022

## 2022-04-30 ENCOUNTER — Encounter (HOSPITAL_COMMUNITY): Payer: Self-pay | Admitting: Surgery

## 2022-04-30 LAB — CBC
HCT: 31.9 % — ABNORMAL LOW (ref 36.0–46.0)
Hemoglobin: 10.3 g/dL — ABNORMAL LOW (ref 12.0–15.0)
MCH: 30 pg (ref 26.0–34.0)
MCHC: 32.3 g/dL (ref 30.0–36.0)
MCV: 93 fL (ref 80.0–100.0)
Platelets: 281 10*3/uL (ref 150–400)
RBC: 3.43 MIL/uL — ABNORMAL LOW (ref 3.87–5.11)
RDW: 13.4 % (ref 11.5–15.5)
WBC: 9.3 10*3/uL (ref 4.0–10.5)
nRBC: 0 % (ref 0.0–0.2)

## 2022-04-30 LAB — BASIC METABOLIC PANEL
Anion gap: 7 (ref 5–15)
BUN: 14 mg/dL (ref 8–23)
CO2: 29 mmol/L (ref 22–32)
Calcium: 8.2 mg/dL — ABNORMAL LOW (ref 8.9–10.3)
Chloride: 100 mmol/L (ref 98–111)
Creatinine, Ser: 1.13 mg/dL — ABNORMAL HIGH (ref 0.44–1.00)
GFR, Estimated: 55 mL/min — ABNORMAL LOW (ref >60)
Glucose, Bld: 97 mg/dL (ref 70–99)
Potassium: 3 mmol/L — ABNORMAL LOW (ref 3.5–5.1)
Sodium: 136 mmol/L (ref 135–145)

## 2022-04-30 NOTE — Discharge Instructions (Signed)
[facility], [place] [phone]  POST OP INSTRUCTIONS   A prescription for pain medication may be given to you upon discharge.  Take your pain medication as prescribed, if needed.  If narcotic pain medicine is not needed, then you may take acetaminophen (Tylenol) as needed. Take your usually prescribed medications unless otherwise directed. If you need a refill on your pain medication, please contact your pharmacy. They will contact our office to request authorization.  Prescriptions will not be filled after 5pm or on week-ends. You should follow a light diet the first few days after arrival home, such as soup and crackers, pudding, etc.unless your doctor has advised otherwise.  Be sure to include lots of fluids daily. Most patients will experience some swelling and bruising.  Ice packs will help.  Swelling and bruising can take several days to resolve It is common to experience some constipation if taking pain medication after surgery.  Increasing fluid intake and taking a stool softener will usually help or prevent this problem from occurring.    You may have steri-strips (small skin tapes) in place directly over the incision.  These strips should be left on the skin for 7-10 days.  If your surgeon used skin glue on the incision, you may shower in 24 hours.  The glue will flake off over the next 2-3 weeks.  Any sutures or staples will be removed at the office during your follow-up visit. You may find that a light gauze bandage over your incision may keep your staples from being rubbed or pulled. You may shower and replace the bandage daily. ACTIVITIES:  You may resume regular (light) daily activities beginning the next day--such as daily self-care, walking, climbing stairs--gradually increasing activities as tolerated.  You may have sexual intercourse when it is comfortable.  Refrain from any heavy lifting or straining until approved by your doctor. You may drive when you no longer are  taking prescription pain medication, you can comfortably wear a seatbelt, and you can safely maneuver your car and apply brakes Return to Work: ___________________________________ Jade Alvarez should see your doctor in the office for a follow-up appointment approximately two weeks after your surgery.  Make sure that you call for this appointment within a day or two after you arrive home to insure a convenient appointment time. OTHER INSTRUCTIONS:  _____________________________________________________________ _____________________________________________________________  WHEN TO CALL YOUR DOCTOR: Fever over 101.0 Inability to urinate Nausea and/or vomiting Extreme swelling or bruising Continued bleeding from incision. Increased pain, redness, or drainage from the incision.   The clinic staff is available to answer your questions during regular business hours.  Please don't hesitate to call and ask to speak to one of the nurses if you have concerns.  For further questions, please visit [email or web address]

## 2022-04-30 NOTE — Plan of Care (Signed)
Problem: Education: Goal: Understanding of discharge needs will improve 04/30/2022 0941 by Tanda Rockers, RN Outcome: Adequate for Discharge 04/30/2022 0940 by Tanda Rockers, RN Outcome: Progressing Goal: Verbalization of understanding of the causes of altered bowel function will improve 04/30/2022 0941 by Tanda Rockers, RN Outcome: Adequate for Discharge 04/30/2022 0940 by Tanda Rockers, RN Outcome: Progressing   Problem: Activity: Goal: Ability to tolerate increased activity will improve 04/30/2022 0941 by Tanda Rockers, RN Outcome: Adequate for Discharge 04/30/2022 0940 by Tanda Rockers, RN Outcome: Progressing   Problem: Bowel/Gastric: Goal: Gastrointestinal status for postoperative course will improve 04/30/2022 0941 by Tanda Rockers, RN Outcome: Adequate for Discharge 04/30/2022 0940 by Tanda Rockers, RN Outcome: Progressing   Problem: Health Behavior/Discharge Planning: Goal: Identification of community resources to assist with postoperative recovery needs will improve 04/30/2022 0941 by Tanda Rockers, RN Outcome: Adequate for Discharge 04/30/2022 0940 by Tanda Rockers, RN Outcome: Progressing   Problem: Nutritional: Goal: Will attain and maintain optimal nutritional status will improve 04/30/2022 0941 by Tanda Rockers, RN Outcome: Adequate for Discharge 04/30/2022 0940 by Tanda Rockers, RN Outcome: Progressing   Problem: Clinical Measurements: Goal: Postoperative complications will be avoided or minimized 04/30/2022 0941 by Tanda Rockers, RN Outcome: Adequate for Discharge 04/30/2022 0940 by Tanda Rockers, RN Outcome: Progressing   Problem: Respiratory: Goal: Respiratory status will improve 04/30/2022 0941 by Tanda Rockers, RN Outcome: Adequate for Discharge 04/30/2022 0940 by Tanda Rockers, RN Outcome: Progressing   Problem: Skin Integrity: Goal: Will show signs of wound  healing 04/30/2022 0941 by Tanda Rockers, RN Outcome: Adequate for Discharge 04/30/2022 0940 by Tanda Rockers, RN Outcome: Progressing   Problem: Education: Goal: Knowledge of General Education information will improve Description: Including pain rating scale, medication(s)/side effects and non-pharmacologic comfort measures 04/30/2022 0941 by Tanda Rockers, RN Outcome: Adequate for Discharge 04/30/2022 0940 by Tanda Rockers, RN Outcome: Progressing   Problem: Health Behavior/Discharge Planning: Goal: Ability to manage health-related needs will improve 04/30/2022 0941 by Tanda Rockers, RN Outcome: Adequate for Discharge 04/30/2022 0940 by Tanda Rockers, RN Outcome: Progressing   Problem: Clinical Measurements: Goal: Ability to maintain clinical measurements within normal limits will improve 04/30/2022 0941 by Tanda Rockers, RN Outcome: Adequate for Discharge 04/30/2022 0940 by Tanda Rockers, RN Outcome: Progressing Goal: Will remain free from infection 04/30/2022 0941 by Tanda Rockers, RN Outcome: Adequate for Discharge 04/30/2022 0940 by Tanda Rockers, RN Outcome: Progressing Goal: Diagnostic test results will improve 04/30/2022 0941 by Tanda Rockers, RN Outcome: Adequate for Discharge 04/30/2022 0940 by Tanda Rockers, RN Outcome: Progressing Goal: Respiratory complications will improve 04/30/2022 0941 by Tanda Rockers, RN Outcome: Adequate for Discharge 04/30/2022 0940 by Tanda Rockers, RN Outcome: Progressing Goal: Cardiovascular complication will be avoided 04/30/2022 0941 by Tanda Rockers, RN Outcome: Adequate for Discharge 04/30/2022 0940 by Tanda Rockers, RN Outcome: Progressing   Problem: Activity: Goal: Risk for activity intolerance will decrease 04/30/2022 0941 by Tanda Rockers, RN Outcome: Adequate for Discharge 04/30/2022 0940 by Tanda Rockers, RN Outcome: Progressing   Problem:  Nutrition: Goal: Adequate nutrition will be maintained 04/30/2022 0941 by Tanda Rockers, RN Outcome: Adequate for Discharge 04/30/2022 0940 by Tanda Rockers, RN Outcome: Progressing   Problem: Coping: Goal: Level of anxiety will decrease 04/30/2022 0941 by Tanda Rockers, RN Outcome: Adequate for Discharge 04/30/2022 0940 by Tanda Rockers, RN  Outcome: Progressing   Problem: Elimination: Goal: Will not experience complications related to bowel motility 04/30/2022 0941 by Tanda Rockers, RN Outcome: Adequate for Discharge 04/30/2022 0940 by Tanda Rockers, RN Outcome: Progressing Goal: Will not experience complications related to urinary retention 04/30/2022 0941 by Tanda Rockers, RN Outcome: Adequate for Discharge 04/30/2022 0940 by Tanda Rockers, RN Outcome: Progressing   Problem: Pain Managment: Goal: General experience of comfort will improve 04/30/2022 0941 by Tanda Rockers, RN Outcome: Adequate for Discharge 04/30/2022 0940 by Tanda Rockers, RN Outcome: Progressing   Problem: Safety: Goal: Ability to remain free from injury will improve 04/30/2022 0941 by Tanda Rockers, RN Outcome: Adequate for Discharge 04/30/2022 0940 by Tanda Rockers, RN Outcome: Progressing   Problem: Skin Integrity: Goal: Risk for impaired skin integrity will decrease 04/30/2022 0941 by Tanda Rockers, RN Outcome: Adequate for Discharge 04/30/2022 0940 by Tanda Rockers, RN Outcome: Progressing

## 2022-04-30 NOTE — Discharge Summary (Signed)
Physician Discharge Summary  Patient ID: Jade Alvarez MRN: [id number] DOB/AGE: [birth year] 61 y.o.  Admit date: 04/28/2022 Discharge date: 04/30/2022  Admission Diagnoses: Sigmoid colon cancer  Discharge Diagnoses:  Principal Problem:   S/P left hemicolectomy   Discharged Condition: good  Hospital Course: 61 yof s/p robotic left colon and loa. She has flatus and loose bms. Pain controlled, tol her diet. She is ready for dc.  Cr up a little postop, voiding fine with good uop, can recheck as outpatient  Consults: None  Significant Diagnostic Studies: none  Treatments: surgery: left colon   Discharge Exam: Blood pressure 129/75, pulse 87, temperature 98.4 F (36.9 C), temperature source Oral, resp. rate 16, height '5\' 2"'$  (1.575 m), weight 70 kg, SpO2 100 %. Ab soft nontender incisions clean   Disposition: Discharge disposition: 01-Home or Self Care        Allergies as of 04/30/2022       Reactions   Penicillins Hives, Shortness Of Breath   Did it involve swelling of the face/tongue/throat, SOB, or low BP?No Did it involve sudden or severe rash/hives, skin peeling, or any reaction on the inside of your mouth or nose?Yes Did you need to seek medical attention at a hospital or doctor's office? No When did it last happen? 61yr old  If all above answers are "NO", may proceed with cephalosporin use.        Medication List     TAKE these medications    Edarbyclor 40-25 MG Tabs Generic drug: Azilsartan-Chlorthalidone TAKE 1 TABLET BY MOUTH ONCE DAILY   traMADol 50 MG tablet Commonly known as: Ultram Take 1 tablet (50 mg total) by mouth every 6 (six) hours as needed for up to 5 days (postop pain not controlled with tylenol first).        Follow-up Information     WIleana Roup MD Follow up in 2 week(s).   Specialties: General Surgery, Colon and Rectal Surgery Contact information: [id number] [place]  [phone]                Signed: MRolm Bookbinder7/16/2023, 9:13 AM

## 2022-04-30 NOTE — Progress Notes (Signed)
Assessment unchanged. Pt and husband verbalized understanding of dc instructions through teach back of medications to resume and follow up care. Discharged via wc to front entrance accompanied by NT and husband.

## 2022-05-01 ENCOUNTER — Other Ambulatory Visit: Payer: Self-pay

## 2022-05-01 NOTE — Anesthesia Postprocedure Evaluation (Signed)
Anesthesia Post Note  Patient: Jade Alvarez  Procedure(s) Performed: XI ROBOT ASSISTED LEFT HEMI COLECTOMY, TAKEDOWN OF SPLENIC FLEXURE INTRAOPERATIVE ASSESSMENT OF PERFUSION ICG FLEXIBLE SIGMOIDOSCOPY     Patient location during evaluation: PACU Anesthesia Type: General Level of consciousness: sedated and patient cooperative Pain management: pain level controlled Vital Signs Assessment: post-procedure vital signs reviewed and stable Respiratory status: spontaneous breathing Cardiovascular status: stable Anesthetic complications: no   No notable events documented.  Last Vitals:  Vitals:   04/29/22 2109 04/30/22 0419  BP: 119/61 129/75  Pulse: 88 87  Resp: 16 16  Temp: 37.1 C 36.9 C  SpO2: 98% 100%    Last Pain:  Vitals:   04/30/22 0758  TempSrc:   PainSc: 0-No pain                 Nolon Nations

## 2022-05-02 LAB — SURGICAL PATHOLOGY

## 2022-05-05 ENCOUNTER — Telehealth: Payer: Self-pay | Admitting: Hematology

## 2022-05-05 NOTE — Telephone Encounter (Signed)
Scheduled appt per 7/19 referral. Pt is aware of appt date and time. Pt is aware to arrive 15 mins prior to appt time and to bring and updated insurance card. Pt is aware of appt location.   

## 2022-05-10 ENCOUNTER — Other Ambulatory Visit: Payer: Self-pay

## 2022-05-10 NOTE — Progress Notes (Signed)
The proposed treatment discussed in conference is for discussion purpose only and is not a binding recommendation.  The patients have not been physically examined, or presented with their treatment options.  Therefore, final treatment plans cannot be decided.  

## 2022-05-18 ENCOUNTER — Telehealth: Payer: Self-pay | Admitting: Pharmacy Technician

## 2022-05-18 ENCOUNTER — Encounter: Payer: Self-pay | Admitting: Hematology

## 2022-05-18 ENCOUNTER — Telehealth: Payer: Self-pay | Admitting: Pharmacist

## 2022-05-18 ENCOUNTER — Inpatient Hospital Stay: Payer: No Typology Code available for payment source | Attending: Hematology | Admitting: Hematology

## 2022-05-18 ENCOUNTER — Other Ambulatory Visit: Payer: Self-pay

## 2022-05-18 ENCOUNTER — Other Ambulatory Visit (HOSPITAL_COMMUNITY): Payer: Self-pay

## 2022-05-18 VITALS — BP 144/78 | HR 92 | Temp 97.7°F | Resp 18 | Wt 159.1 lb

## 2022-05-18 DIAGNOSIS — Z79899 Other long term (current) drug therapy: Secondary | ICD-10-CM | POA: Diagnosis not present

## 2022-05-18 DIAGNOSIS — Z90710 Acquired absence of both cervix and uterus: Secondary | ICD-10-CM | POA: Diagnosis not present

## 2022-05-18 DIAGNOSIS — I10 Essential (primary) hypertension: Secondary | ICD-10-CM | POA: Insufficient documentation

## 2022-05-18 DIAGNOSIS — C187 Malignant neoplasm of sigmoid colon: Secondary | ICD-10-CM

## 2022-05-18 DIAGNOSIS — Z5111 Encounter for antineoplastic chemotherapy: Secondary | ICD-10-CM | POA: Insufficient documentation

## 2022-05-18 MED ORDER — CAPECITABINE 500 MG PO TABS
ORAL_TABLET | ORAL | 0 refills | Status: DC
  Filled 2022-05-18: qty 56, fill #0

## 2022-05-18 MED ORDER — ONDANSETRON HCL 8 MG PO TABS
8.0000 mg | ORAL_TABLET | Freq: Two times a day (BID) | ORAL | 1 refills | Status: DC | PRN
  Filled 2022-05-18: qty 30, 15d supply, fill #0
  Filled 2022-05-19: qty 18, 21d supply, fill #0

## 2022-05-18 MED ORDER — PROCHLORPERAZINE MALEATE 10 MG PO TABS
10.0000 mg | ORAL_TABLET | Freq: Four times a day (QID) | ORAL | 1 refills | Status: DC | PRN
  Filled 2022-05-18 – 2022-05-19 (×2): qty 30, 8d supply, fill #0

## 2022-05-18 NOTE — Progress Notes (Addendum)
[place]   Telephone:(336) [phone] Fax:(336) [phone]   Clinic New Consult Note   Patient Care Team: Denita Lung, MD as PCP - General (Family Medicine)  Date of Service:  05/18/2022   CHIEF COMPLAINTS/PURPOSE OF CONSULTATION:  Colon Cancer  REFERRING PHYSICIAN:  Dr. Dema Severin  ASSESSMENT & PLAN:  Jade Alvarez is a 61 y.o. female with   1. 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 Colonoscopy on 03/03/22 showed partially obstructing tumor in proximal sigmoid colon, path confirmed adenocarcinoma. -s/p resection 04/28/22, path showed 3.3 cm invasive adenocarcinoma, moderately differentiated, extending into pericolonic tissue. Margins negative, 3/23 positive lymph nodes. -I reviewed the pathology results with the patient and her husband today. I discussed that high recurrence rate in stage III colon cancer. The standard recommendation is for adjuvant chemotherapy, which will reduce risk by 30-40%. I reviewed the standard treatment options, including FOLFOX (IV oxaliplatin and 5-FU pump) or CAPEOX (IV oxaliplatin and oral Xeloda). I discussed the regimen schedule and potential side effects in great detail with her and her husband. Given she continues to work full time, she is more interested in [place]. I recommend 3 months therapy  --Chemotherapy consent: Side effects including but does not not limited to, fatigue, nausea, vomiting, diarrhea, hair loss, neuropathy, cold sensitivity, fluid retention, renal and kidney dysfunction, neutropenic fever, needed for blood transfusion, bleeding, were discussed with patient in great detail. She is agreeable with oral Xeloda, but is somewhat reluctant to take IV oxaliplatin  -she tells me she has a trip planned 8/17 - 8/20. We will plan to start Xeloda soon and add the oxali infusion after she returns. Our oral pharmacist Wells Guiles and nurse navigator Santiago Glad met with them today. -no port placement    2. HTN -Continue meds   PLAN:  -I called in Xeloda,she will start 1500 mg a.m., 2000 mg p.m., every 12 hours, for 7 days on 8/7 -chemo education -lab, f/u and oxaliplatin on 8/24    Oncology History Overview Note   Cancer Staging  Colon cancer [facility]) Staging form: Colon and Rectum, AJCC 8th Edition - Pathologic stage from 04/28/2022: Stage IIIB (pT3, pN1b, cM0) - Signed by Truitt Merle, MD on 05/18/2022    Colon cancer ([place])  06/23/2021 Miscellaneous   Cologuard - Positive   03/03/2022 Procedure   Colonoscopy, Dr. Candis Schatz  Impression: - Rule out malignancy, partially obstructing tumor in the proximal sigmoid colon. Biopsied. Tattooed. - Two 4 to 7 mm polyps in the transverse colon, removed with a cold snare. Resected and retrieved. - One 4 mm polyp at the hepatic flexure, removed with a cold snare. Resected and retrieved. - One 4 mm polyp at the splenic flexure, removed with a cold snare. Resected and retrieved. - One 3 mm polyp in the descending colon, removed with a cold snare. Resected and retrieved. - One 2 mm polyp in the rectum, removed with a cold snare. Resected and retrieved. - The examined portion of the ileum was normal. - The distal rectum and anal verge are normal on retroflexion view.   03/03/2022 Initial Biopsy   Diagnosis 1. Hepatic Flexure Biopsy, transverse, splenic flexure, descending, rectal, polyps (6) TUBULAR ADENOMAS. NEGATIVE FOR HIGH-GRADE DYSPLASIA. 2. Sigmoid Colon Biopsy INVASIVE ADENOCARCINOMA, MODERATELY DIFFERENTIATED (GRADE 2).   03/17/2022 Imaging   EXAM: CT CHEST, ABDOMEN, AND PELVIS WITH CONTRAST  IMPRESSION: 1. Short segment wall thickening of the descending colon which possibly reflects patient's known primary colonic neoplasm. 2. Prominent  mesenteric lymph nodes adjacent to the area of descending colonic wall thickening measure up to 8 mm and likely reflect local nodal involvement. 3. No evidence of distant metastatic disease in the  chest, abdomen or pelvis.   04/28/2022 Initial Diagnosis   Colon cancer ([place])   04/28/2022 Cancer Staging   Staging form: Colon and Rectum, AJCC 8th Edition - Pathologic stage from 04/28/2022: Stage IIIB (pT3, pN1b, cM0) - Signed by Truitt Merle, MD on 05/18/2022 Stage prefix: Initial diagnosis Histologic grading system: 4 grade system Histologic grade (G): G2 Residual tumor (R): R0 - None   04/28/2022 Definitive Surgery   FINAL MICROSCOPIC DIAGNOSIS:   A. COLON, LEFT, RESECTION:  - Invasive colonic adenocarcinoma, moderately differentiated, 3.3 cm.  - Carcinoma extends into pericolonic connective tissue.  - All surgical margins negative for carcinoma.  - Metastatic carcinoma in three of twenty-three lymph nodes (3/23) with extranodal extension.  - One satellite tumor nodule.  - See oncology table.       HISTORY OF PRESENTING ILLNESS:  Jade Alvarez 61 y.o. female is a here because of colon cancer. The patient was referred by Dr. Dema Severin. The patient presents to the clinic today accompanied by her husband.   She reports this started with a positive Cologuard test. She denies having any symptoms at that time. She proceeded to colonoscopy on 03/03/22 with Dr. Candis Schatz showing a partially obstructing tumor in proximal sigmoid colon, pathology confirmed invasive moderately differentiated adenocarcinoma. Additionally, 6 polyps were removed, showing tubular adenomas.  She has a PMHx of.... -s/p partial hysterectomy -HTN, on medication  Socially... 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 -never smoker, does not drink alcohol.   REVIEW OF SYSTEMS:    Constitutional: Denies fevers, chills or abnormal night sweats Eyes: Denies blurriness of vision, double vision or watery eyes Ears, nose, mouth, throat, and face: Denies mucositis or sore  throat Respiratory: Denies cough, dyspnea or wheezes Cardiovascular: Denies palpitation, chest discomfort or lower extremity swelling Gastrointestinal:  Denies nausea, heartburn or change in bowel habits Skin: Denies abnormal skin rashes Lymphatics: Denies new lymphadenopathy or easy bruising Neurological:Denies numbness, tingling or new weaknesses Behavioral/Psych: Mood is stable, no new changes  All other systems were reviewed with the patient and are negative.   MEDICAL HISTORY:  Past Medical History:  Diagnosis Date   Anemia    PAST HX - NO ISSUES SINCE PARTIAL HYSTERECTOMY   Blood transfusion without reported diagnosis    PRIOR TO HYSTERECTOMY   Colon cancer ([place]) 04/28/2022   Hypertension    Positive colorectal cancer screening using Cologuard test 06/24/2021    SURGICAL HISTORY: Past Surgical History:  Procedure Laterality Date   ABDOMINAL HYSTERECTOMY     PARTIAL   CESAREAN SECTION     x2   COLONOSCOPY  03/03/2022   FLEXIBLE SIGMOIDOSCOPY N/A 04/28/2022   Procedure: FLEXIBLE SIGMOIDOSCOPY;  Surgeon: Ileana Roup, MD;  Location: [facility];  Service: General;  Laterality: N/A;   WISDOM TOOTH EXTRACTION      SOCIAL HISTORY: Social History   Socioeconomic History   Marital status: Married    Spouse name: Not on file   Number of children: 2   Years of education: Not on file   Highest education level: Not on file  Occupational History   Not on file  Tobacco Use   Smoking status: Never  Smokeless tobacco: Never  Vaping Use   Vaping Use: Never used  Substance and Sexual Activity   Alcohol use: Never   Drug use: Never   Sexual activity: Yes    Birth control/protection: Post-menopausal, Surgical  Other Topics Concern   Not on file  Social History Narrative   Not on file   Social Determinants of Health   Financial Resource Strain: Not on file  Food Insecurity: Not on file  Transportation Needs: Not on file  Physical Activity: Not on file  Stress:  Not on file  Social Connections: Not on file  Intimate Partner Violence: Not on file    FAMILY HISTORY: Family History  Problem Relation Age of Onset   Colon cancer Neg Hx    Colon polyps Neg Hx    Esophageal cancer Neg Hx    Rectal cancer Neg Hx    Stomach cancer Neg Hx     ALLERGIES:  is allergic to penicillins.  MEDICATIONS:  Current Outpatient Medications  Medication Sig Dispense Refill   capecitabine (XELODA) 500 MG tablet Take 3 tabs in morning and 4 tabs in evening, every 12 hours, takes with food. Take for 7 days then off for 7 days. 56 tablet 0   EDARBYCLOR 40-25 MG TABS TAKE 1 TABLET BY MOUTH ONCE DAILY (Patient taking differently: Take 1 tablet by mouth daily.) 90 tablet 0   No current facility-administered medications for this visit.    PHYSICAL EXAMINATION: ECOG PERFORMANCE STATUS: 0 - Asymptomatic  Vitals:   05/18/22 1505  BP: (!) 144/78  Pulse: 92  Resp: 18  Temp: 97.7 F (36.5 C)  SpO2: 97%   Filed Weights   05/18/22 1505  Weight: 159 lb 1 oz (72.2 kg)    GENERAL:alert, no distress and comfortable SKIN: skin color, texture, turgor are normal, no rashes or significant lesions EYES: normal, Conjunctiva are pink and non-injected, sclera clear  NECK: supple, thyroid normal size, non-tender, without nodularity LYMPH:  no palpable lymphadenopathy in the cervical, axillary  LUNGS: clear to auscultation and percussion with normal breathing effort HEART: regular rate & rhythm and no murmurs and no lower extremity edema ABDOMEN:abdomen soft, non-tender and normal bowel sounds Musculoskeletal:no cyanosis of digits and no clubbing  NEURO: alert & oriented x 3 with fluent speech, no focal motor/sensory deficits  LABORATORY DATA:  I have reviewed the data as listed    Latest Ref Rng & Units 04/30/2022    4:04 AM 04/29/2022    4:37 AM 04/21/2022    1:35 PM  CBC  WBC 4.0 - 10.5 K/uL 9.3  9.8  7.7   Hemoglobin 12.0 - 15.0 g/dL 10.3  10.2  13.3   Hematocrit  36.0 - 46.0 % 31.9  31.5  41.7   Platelets 150 - 400 K/uL 281  287  408        Latest Ref Rng & Units 04/30/2022    4:04 AM 04/29/2022    4:37 AM 04/21/2022    1:35 PM  CMP  Glucose 70 - 99 mg/dL 97  103  91   BUN 8 - 23 mg/dL _0 Creatinine 0.44 - 1.00 mg/dL 1.13  1.07  0.99   Sodium 135 - 145 mmol/L 136  140  136   Potassium 3.5 - 5.1 mmol/L 3.0  3.1  3.6   Chloride 98 - 111 mmol/L 100  101  99   CO2 22 - 32 mmol/L 29  31  27  Calcium 8.9 - 10.3 mg/dL 8.2  8.2  9.1   Total Protein 6.5 - 8.1 g/dL   8.1   Total Bilirubin 0.3 - 1.2 mg/dL   0.6   Alkaline Phos 38 - 126 U/L   52   AST 15 - 41 U/L   19   ALT 0 - 44 U/L   19      RADIOGRAPHIC STUDIES: I have personally reviewed the radiological images as listed and agreed with the findings in the report. No results found.   No orders of the defined types were placed in this encounter.   All questions were answered. The patient knows to call the clinic with any problems, questions or concerns. The total time spent in the appointment was 60 minutes.     Truitt Merle, MD 05/18/2022   I, Wilburn Mylar, am acting as scribe for Truitt Merle, MD.

## 2022-05-18 NOTE — Telephone Encounter (Addendum)
Oral Chemotherapy Pharmacist Encounter  I met with patient and patient's husband in clinic for overview of: Xeloda (capecitabine) for the  treatment of stage IIIB colon cancer, planned to be in conjunction with oxaliplatin after first cycle of Xeloda. Planned duration 3 months.  Counseled patient on administration, dosing, side effects, monitoring, drug-food interactions, safe handling, storage, and disposal.  BMP and CBC from 04/30/22 assessed - no baseline dose adjustments required. Prescription dose and frequency assessed for appropriateness. Appropriate for therapy initiation.   Patient will take Xeloda 548m tablets, 3 tablets (15024m by mouth in AM and 4 tabs (200051mby mouth in PM, within 30 minutes of finishing meals, for 7 days on, 7 days off, repeated every 14 days. Cycle length will change once oxaliplatin is added.   Xeloda start date: 05/22/22  Adverse effects include but are not limited to: fatigue, decreased blood counts, GI upset, diarrhea, mouth sores, and hand-foot syndrome. Patient will obtain anti diarrheal and alert the office of 4 or more loose stools above baseline.  Reviewed with patient importance of keeping a medication schedule and plan for any missed doses. No barriers to medication adherence identified.  Medication reconciliation performed and medication/allergy list updated. No relevant/significant drug-drug interactions with Xeloda identified.   Patient's insurance [facility] so she is able to start prior to trip.   All questions answered.  Patient agreement for treatment documented in MD note on 05/18/22.  Ms. ThoGrandville Silosiced understanding and appreciation.   Medication education handout given to patient. Patient knows to call the office with questions or concerns. Oral Chemotherapy Clinic phone number provided to patient.   RebLeron CroakPharmD, BCPS, BCOKindred Hospital The Heightsmatology/Oncology Clinical Pharmacist [place]vina Sidled [phone]/2023 4:36 PM

## 2022-05-18 NOTE — Progress Notes (Signed)
I met with Ms hegwood and her husband after  her consultation with Dr Burr Medico.  I explained my role as a nurse navigator and provided my contact information.  I explained the services provided at [facility] and provided written information.  I All questions were answered. They verbalized understanding.

## 2022-05-18 NOTE — Telephone Encounter (Signed)
Oral Oncology Patient Advocate Encounter   Received notification that prior authorization for Capecitabine is required.   PA submitted on 05/18/2022 Key [id number] Status is pending     Lady Deutscher, CPhT-Adv Pharmacy Patient Advocate Specialist [place] Patient Advocate Team Direct Number: [phone]  Fax: [phone]

## 2022-05-18 NOTE — Progress Notes (Signed)
START ON PATHWAY REGIMEN - Colorectal     A cycle is every 21 days:     Capecitabine      Oxaliplatin   **Always confirm dose/schedule in your pharmacy ordering system**  Patient Characteristics: Postoperative without Neoadjuvant Therapy, M0 (Pathologic Staging), Colon, Stage III, Low Risk (pT1-3, pN1) Tumor Location: Colon Therapeutic Status: Postoperative without Neoadjuvant Therapy, M0 (Pathologic Staging) AJCC M Category: cM0 AJCC T Category: pT3 AJCC N Category: pN1b AJCC 8 Stage Grouping: IIIB Intent of Therapy: Curative Intent, Discussed with Patient 

## 2022-05-19 ENCOUNTER — Other Ambulatory Visit (HOSPITAL_COMMUNITY): Payer: Self-pay

## 2022-05-19 ENCOUNTER — Telehealth: Payer: Self-pay | Admitting: Hematology

## 2022-05-19 ENCOUNTER — Other Ambulatory Visit: Payer: Self-pay

## 2022-05-19 MED ORDER — CAPECITABINE 500 MG PO TABS
ORAL_TABLET | ORAL | 0 refills | Status: DC
  Filled 2022-05-19: qty 49, fill #0
  Filled 2022-05-19: qty 49, 14d supply, fill #0

## 2022-05-19 NOTE — Telephone Encounter (Signed)
Oral Oncology Patient Advocate Encounter  Prior Authorization for Capecitabine has been approved.    PA# [id number]  Effective dates: 05/19/2022 through 05/19/2023  [facility].    Lady Deutscher, CPhT-Adv Pharmacy Patient Advocate Specialist [place] Patient Advocate Team Direct Number: [phone]  Fax: [phone]

## 2022-05-19 NOTE — Telephone Encounter (Signed)
Left message with follow-up appointments per 8/3 los.

## 2022-05-23 ENCOUNTER — Encounter: Payer: Self-pay | Admitting: Hematology

## 2022-05-26 ENCOUNTER — Inpatient Hospital Stay (HOSPITAL_BASED_OUTPATIENT_CLINIC_OR_DEPARTMENT_OTHER): Payer: No Typology Code available for payment source | Admitting: Hematology

## 2022-05-26 DIAGNOSIS — C187 Malignant neoplasm of sigmoid colon: Secondary | ICD-10-CM | POA: Diagnosis not present

## 2022-05-26 DIAGNOSIS — I10 Essential (primary) hypertension: Secondary | ICD-10-CM | POA: Diagnosis not present

## 2022-05-26 DIAGNOSIS — Z79899 Other long term (current) drug therapy: Secondary | ICD-10-CM | POA: Diagnosis not present

## 2022-05-26 DIAGNOSIS — Z5111 Encounter for antineoplastic chemotherapy: Secondary | ICD-10-CM | POA: Diagnosis not present

## 2022-05-26 DIAGNOSIS — Z90710 Acquired absence of both cervix and uterus: Secondary | ICD-10-CM | POA: Diagnosis not present

## 2022-05-26 NOTE — Progress Notes (Signed)
[place]   Telephone:(336) [phone] Fax:(336) [phone]   Clinic Follow up Note   Patient Care Team: Denita Lung, MD as PCP - General (Family Medicine) Truitt Merle, MD as Consulting Physician (Oncology) Royston Bake, RN as Oncology Nurse Navigator (Oncology)  Date of Service:  05/26/2022  I connected with Jade Alvarez on 05/26/2022 at 12:20 PM EDT by telephone visit and verified that I am speaking with the correct person using two identifiers.  I discussed the limitations, risks, security and privacy concerns of performing an evaluation and management service by telephone and the availability of in person appointments. I also discussed with the patient that there may be a patient responsible charge related to this service. The patient expressed understanding and agreed to proceed.   Other persons participating in the visit and their role in the encounter:  none  Patient's location:  home Provider's location:  my office  CHIEF COMPLAINT: f/u of colon cancer  CURRENT THERAPY:  Adjuvant Xeloda, starting 05/22/22  -dose: '1500mg'$  AM, '2000mg'$  PM q12hr  ASSESSMENT & PLAN:  Jade Alvarez is a 61 y.o. female with   1. 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 Colonoscopy on 03/03/22 showed partially obstructing tumor in proximal sigmoid colon, path confirmed adenocarcinoma. -s/p resection 04/28/22, path showed 3.3 cm invasive adenocarcinoma, moderately differentiated, extending into pericolonic tissue. Margins negative, 3/23 positive lymph nodes. -she began capecitabine on 05/22/22. She reports she has tolerated well thus far with no immediate side effects. -We are holding oxaliplatin due to her upcoming vacation scheduled for 8/17 - 8/20. We will plan to start oxali infusion after she returns, currently scheduled for 8/24, no port placement. -she expressed a lot of concern and confusion with her upcoming appointments, stating she's  very overwhelmed by it all. I replied with understanding and reminded her that she may need to take time off for her health. She is reluctant to do this. We will do our best to work around her schedule.   2. HTN -Continue meds     PLAN:  -continue Xeloda, 1500 mg a.m., 2000 mg p.m., every 12 hours, through 8/13, she is tolerating well so far  -chemo education 8/22  -I will try to see her that day, but she needs late afternoon appointments so she can still work. -first cycle oxaliplatin on 8/24    No problem-specific Assessment & Plan notes found for this encounter.   SUMMARY OF ONCOLOGIC HISTORY: Oncology History Overview Note   Cancer Staging  Colon cancer [facility]) Staging form: Colon and Rectum, AJCC 8th Edition - Pathologic stage from 04/28/2022: Stage IIIB (pT3, pN1b, cM0) - Signed by Truitt Merle, MD on 05/18/2022    Colon cancer ([place])  06/23/2021 Miscellaneous   Cologuard - Positive   03/03/2022 Procedure   Colonoscopy, Dr. Candis Schatz  Impression: - Rule out malignancy, partially obstructing tumor in the proximal sigmoid colon. Biopsied. Tattooed. - Two 4 to 7 mm polyps in the transverse colon, removed with a cold snare. Resected and retrieved. - One 4 mm polyp at the hepatic flexure, removed with a cold snare. Resected and retrieved. - One 4 mm polyp at the splenic flexure, removed with a cold snare. Resected and retrieved. - One 3 mm polyp in the descending colon, removed with a cold snare. Resected and retrieved. - One 2 mm polyp in the rectum, removed with a cold snare. Resected and retrieved. - The examined portion of the ileum was normal. -  The distal rectum and anal verge are normal on retroflexion view.   03/03/2022 Initial Biopsy   Diagnosis 1. Hepatic Flexure Biopsy, transverse, splenic flexure, descending, rectal, polyps (6) TUBULAR ADENOMAS. NEGATIVE FOR HIGH-GRADE DYSPLASIA. 2. Sigmoid Colon Biopsy INVASIVE ADENOCARCINOMA, MODERATELY DIFFERENTIATED (GRADE 2).    03/17/2022 Imaging   EXAM: CT CHEST, ABDOMEN, AND PELVIS WITH CONTRAST  IMPRESSION: 1. Short segment wall thickening of the descending colon which possibly reflects patient's known primary colonic neoplasm. 2. Prominent mesenteric lymph nodes adjacent to the area of descending colonic wall thickening measure up to 8 mm and likely reflect local nodal involvement. 3. No evidence of distant metastatic disease in the chest, abdomen or pelvis.   04/28/2022 Initial Diagnosis   Colon cancer ([place])   04/28/2022 Cancer Staging   Staging form: Colon and Rectum, AJCC 8th Edition - Pathologic stage from 04/28/2022: Stage IIIB (pT3, pN1b, cM0) - Signed by Truitt Merle, MD on 05/18/2022 Stage prefix: Initial diagnosis Histologic grading system: 4 grade system Histologic grade (G): G2 Residual tumor (R): R0 - None   04/28/2022 Definitive Surgery   FINAL MICROSCOPIC DIAGNOSIS:   A. COLON, LEFT, RESECTION:  - Invasive colonic adenocarcinoma, moderately differentiated, 3.3 cm.  - Carcinoma extends into pericolonic connective tissue.  - All surgical margins negative for carcinoma.  - Metastatic carcinoma in three of twenty-three lymph nodes (3/23) with extranodal extension.  - One satellite tumor nodule.  - See oncology table.    06/08/2022 -  Chemotherapy   Patient is on Treatment Plan : COLORECTAL Xelox (Capeox) q21d        INTERVAL HISTORY:  Jade Alvarez was contacted for a follow up of colon cancer. She was last seen by me on 05/18/22 in consultation.  She reports she is doing very well with Xeloda so far.   All other systems were reviewed with the patient and are negative.  MEDICAL HISTORY:  Past Medical History:  Diagnosis Date   Anemia    PAST HX - NO ISSUES SINCE PARTIAL HYSTERECTOMY   Blood transfusion without reported diagnosis    PRIOR TO HYSTERECTOMY   Colon cancer ([place]) 04/28/2022   Hypertension    Positive colorectal cancer screening using Cologuard test 06/24/2021     SURGICAL HISTORY: Past Surgical History:  Procedure Laterality Date   ABDOMINAL HYSTERECTOMY     PARTIAL   CESAREAN SECTION     x2   COLONOSCOPY  03/03/2022   FLEXIBLE SIGMOIDOSCOPY N/A 04/28/2022   Procedure: FLEXIBLE SIGMOIDOSCOPY;  Surgeon: Ileana Roup, MD;  Location: [facility];  Service: General;  Laterality: N/A;   WISDOM TOOTH EXTRACTION      I have reviewed the social history and family history with the patient and they are unchanged from previous note.  ALLERGIES:  is allergic to penicillins.  MEDICATIONS:  Current Outpatient Medications  Medication Sig Dispense Refill   capecitabine (XELODA) 500 MG tablet Take 3 tablets by mouth in morning and 4 tablets in evening every 12 hours. Take within 30 minutes after meals. Take for 7 days then off for 7 days. 49 tablet 0   EDARBYCLOR 40-25 MG TABS TAKE 1 TABLET BY MOUTH ONCE DAILY (Patient taking differently: Take 1 tablet by mouth daily.) 90 tablet 0   ondansetron (ZOFRAN) 8 MG tablet Take 1 tablet (8 mg total) by mouth 2 (two) times daily as needed for refractory nausea / vomiting. Start on day 3 after chemotherapy. 30 tablet 1   prochlorperazine (COMPAZINE) 10 MG tablet Take 1 tablet (  10 mg total) by mouth every 6 (six) hours as needed (Nausea or vomiting). 30 tablet 1   No current facility-administered medications for this visit.    PHYSICAL EXAMINATION: ECOG PERFORMANCE STATUS: 0 - Asymptomatic  There were no vitals filed for this visit. Wt Readings from Last 3 Encounters:  05/18/22 159 lb 1 oz (72.2 kg)  04/30/22 154 lb 5.2 oz (70 kg)  04/21/22 158 lb (71.7 kg)     No vitals taken today, Exam not performed today  LABORATORY DATA:  I have reviewed the data as listed    Latest Ref Rng & Units 04/30/2022    4:04 AM 04/29/2022    4:37 AM 04/21/2022    1:35 PM  CBC  WBC 4.0 - 10.5 K/uL 9.3  9.8  7.7   Hemoglobin 12.0 - 15.0 g/dL 10.3  10.2  13.3   Hematocrit 36.0 - 46.0 % 31.9  31.5  41.7   Platelets 150 -  400 K/uL 281  287  408         Latest Ref Rng & Units 04/30/2022    4:04 AM 04/29/2022    4:37 AM 04/21/2022    1:35 PM  CMP  Glucose 70 - 99 mg/dL 97  103  91   BUN 8 - 23 mg/dL '14  14  20   '$ Creatinine 0.44 - 1.00 mg/dL 1.13  1.07  0.99   Sodium 135 - 145 mmol/L 136  140  136   Potassium 3.5 - 5.1 mmol/L 3.0  3.1  3.6   Chloride 98 - 111 mmol/L 100  101  99   CO2 22 - 32 mmol/L '29  31  27   '$ Calcium 8.9 - 10.3 mg/dL 8.2  8.2  9.1   Total Protein 6.5 - 8.1 g/dL   8.1   Total Bilirubin 0.3 - 1.2 mg/dL   0.6   Alkaline Phos 38 - 126 U/L   52   AST 15 - 41 U/L   19   ALT 0 - 44 U/L   19       RADIOGRAPHIC STUDIES: I have personally reviewed the radiological images as listed and agreed with the findings in the report. No results found.    No orders of the defined types were placed in this encounter.  All questions were answered. The patient knows to call the clinic with any problems, questions or concerns. No barriers to learning was detected. The total time spent in the appointment was 15 minutes.     Truitt Merle, MD 05/26/2022   I, Wilburn Mylar, am acting as scribe for Truitt Merle, MD.   I have reviewed the above documentation for accuracy and completeness, and I agree with the above.

## 2022-06-01 NOTE — Progress Notes (Signed)
Pharmacist Chemotherapy Monitoring - Initial Assessment    Anticipated start date: 06/08/22   The following has been reviewed per standard work regarding the patient's treatment regimen: The patient's diagnosis, treatment plan and drug doses, and organ/hematologic function Lab orders and baseline tests specific to treatment regimen  The treatment plan start date, drug sequencing, and pre-medications Prior authorization status  Patient's documented medication list, including drug-drug interaction screen and prescriptions for anti-emetics and supportive care specific to the treatment regimen The drug concentrations, fluid compatibility, administration routes, and timing of the medications to be used The patient's access for treatment and lifetime cumulative dose history, if applicable  The patient's medication allergies and previous infusion related reactions, if applicable   Changes made to treatment plan:  N/A  Follow up needed:  N/A   Kennith Center, Pharm.D., CPP 06/01/2022'@4'$ :22 PM

## 2022-06-06 ENCOUNTER — Inpatient Hospital Stay: Payer: No Typology Code available for payment source

## 2022-06-06 ENCOUNTER — Ambulatory Visit: Payer: PRIVATE HEALTH INSURANCE | Admitting: Hematology

## 2022-06-06 ENCOUNTER — Encounter: Payer: Self-pay | Admitting: Hematology

## 2022-06-06 ENCOUNTER — Other Ambulatory Visit: Payer: PRIVATE HEALTH INSURANCE

## 2022-06-06 ENCOUNTER — Other Ambulatory Visit: Payer: Self-pay

## 2022-06-06 ENCOUNTER — Inpatient Hospital Stay: Payer: No Typology Code available for payment source | Admitting: Hematology

## 2022-06-06 ENCOUNTER — Other Ambulatory Visit (HOSPITAL_COMMUNITY): Payer: Self-pay

## 2022-06-06 ENCOUNTER — Inpatient Hospital Stay (HOSPITAL_BASED_OUTPATIENT_CLINIC_OR_DEPARTMENT_OTHER): Payer: No Typology Code available for payment source | Admitting: Hematology

## 2022-06-06 DIAGNOSIS — C187 Malignant neoplasm of sigmoid colon: Secondary | ICD-10-CM

## 2022-06-06 DIAGNOSIS — Z79899 Other long term (current) drug therapy: Secondary | ICD-10-CM | POA: Diagnosis not present

## 2022-06-06 DIAGNOSIS — Z5111 Encounter for antineoplastic chemotherapy: Secondary | ICD-10-CM | POA: Diagnosis not present

## 2022-06-06 DIAGNOSIS — I10 Essential (primary) hypertension: Secondary | ICD-10-CM | POA: Diagnosis not present

## 2022-06-06 DIAGNOSIS — Z90710 Acquired absence of both cervix and uterus: Secondary | ICD-10-CM | POA: Diagnosis not present

## 2022-06-06 LAB — CBC WITH DIFFERENTIAL/PLATELET
Abs Immature Granulocytes: 0.04 10*3/uL (ref 0.00–0.07)
Basophils Absolute: 0 10*3/uL (ref 0.0–0.1)
Basophils Relative: 0 %
Eosinophils Absolute: 0.1 10*3/uL (ref 0.0–0.5)
Eosinophils Relative: 1 %
HCT: 36.1 % (ref 36.0–46.0)
Hemoglobin: 12.1 g/dL (ref 12.0–15.0)
Immature Granulocytes: 0 %
Lymphocytes Relative: 30 %
Lymphs Abs: 2.8 10*3/uL (ref 0.7–4.0)
MCH: 30.4 pg (ref 26.0–34.0)
MCHC: 33.5 g/dL (ref 30.0–36.0)
MCV: 90.7 fL (ref 80.0–100.0)
Monocytes Absolute: 1.1 10*3/uL — ABNORMAL HIGH (ref 0.1–1.0)
Monocytes Relative: 12 %
Neutro Abs: 5.3 10*3/uL (ref 1.7–7.7)
Neutrophils Relative %: 57 %
Platelets: 315 10*3/uL (ref 150–400)
RBC: 3.98 MIL/uL (ref 3.87–5.11)
RDW: 14.3 % (ref 11.5–15.5)
WBC: 9.3 10*3/uL (ref 4.0–10.5)
nRBC: 0 % (ref 0.0–0.2)

## 2022-06-06 LAB — COMPREHENSIVE METABOLIC PANEL
ALT: 20 U/L (ref 0–44)
AST: 25 U/L (ref 15–41)
Albumin: 4.4 g/dL (ref 3.5–5.0)
Alkaline Phosphatase: 61 U/L (ref 38–126)
Anion gap: 4 — ABNORMAL LOW (ref 5–15)
BUN: 15 mg/dL (ref 8–23)
CO2: 33 mmol/L — ABNORMAL HIGH (ref 22–32)
Calcium: 9.7 mg/dL (ref 8.9–10.3)
Chloride: 101 mmol/L (ref 98–111)
Creatinine, Ser: 1.08 mg/dL — ABNORMAL HIGH (ref 0.44–1.00)
GFR, Estimated: 58 mL/min — ABNORMAL LOW (ref >60)
Glucose, Bld: 93 mg/dL (ref 70–99)
Potassium: 3.7 mmol/L (ref 3.5–5.1)
Sodium: 138 mmol/L (ref 135–145)
Total Bilirubin: 0.3 mg/dL (ref 0.3–1.2)
Total Protein: 7.6 g/dL (ref 6.5–8.1)

## 2022-06-06 MED ORDER — CAPECITABINE 500 MG PO TABS
ORAL_TABLET | ORAL | 1 refills | Status: DC
  Filled 2022-06-06: qty 84, fill #0
  Filled 2022-06-07: qty 84, 21d supply, fill #0

## 2022-06-06 MED ORDER — ONDANSETRON HCL 8 MG PO TABS: 8.0000 mg | ORAL_TABLET | Freq: Three times a day (TID) | ORAL | 1 refills | Status: DC | PRN

## 2022-06-06 MED ORDER — PROCHLORPERAZINE MALEATE 10 MG PO TABS: 10.0000 mg | ORAL_TABLET | Freq: Four times a day (QID) | ORAL | 1 refills | Status: DC | PRN

## 2022-06-06 NOTE — Progress Notes (Signed)
[place]   Telephone:(336) [phone] Fax:(336) [phone]   Clinic Follow up Note   Patient Care Team: Denita Lung, MD as PCP - General (Family Medicine) Truitt Merle, MD as Consulting Physician (Oncology) Royston Bake, RN as Oncology Nurse Navigator (Oncology)  Date of Service:  06/06/2022  CHIEF COMPLAINT: f/u of colon cancer  CURRENT THERAPY:  Adjuvant CAPEOX, starting 06/08/22             -dose: '1500mg'$  BID q12hr  ASSESSMENT & PLAN:  Jade Alvarez is a 61 y.o. female with   1. 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 Colonoscopy on 03/03/22 showed partially obstructing tumor in proximal sigmoid colon, path confirmed adenocarcinoma. -s/p resection 04/28/22, path showed 3.3 cm invasive adenocarcinoma, moderately differentiated, extending into pericolonic tissue. Margins negative, 3/23 positive lymph nodes. -she took one week capecitabine starting 05/22/22, then went on planned vacation. She tolerated well with no noticeable side effects.  -she is scheduled to start oxaliplatin and Xeloda on 8/24, plan for 6 cycles (~3 months). She will restart Xeloda the same day. She has chemo education today; we also side effects and logistics.chemo consent obtained.  -lab today     2. HTN -Continue meds      PLAN:  -lab and chemo class today -first oxali 8/24, will restart Xeloda same day  -Xeloda dose: '1500mg'$  BID q12hr for day 1-14 every 21 days  -phone visit in 1 week for toxicity check -lab, f/u, and CAPEOX in 3 weeks   No problem-specific Assessment & Plan notes found for this encounter.   SUMMARY OF ONCOLOGIC HISTORY: Oncology History Overview Note   Cancer Staging  Colon cancer [facility]) Staging form: Colon and Rectum, AJCC 8th Edition - Pathologic stage from 04/28/2022: Stage IIIB (pT3, pN1b, cM0) - Signed by Truitt Merle, MD on 05/18/2022    Colon cancer ([place])  06/23/2021 Miscellaneous   Cologuard - Positive   03/03/2022  Procedure   Colonoscopy, Dr. Candis Schatz  Impression: - Rule out malignancy, partially obstructing tumor in the proximal sigmoid colon. Biopsied. Tattooed. - Two 4 to 7 mm polyps in the transverse colon, removed with a cold snare. Resected and retrieved. - One 4 mm polyp at the hepatic flexure, removed with a cold snare. Resected and retrieved. - One 4 mm polyp at the splenic flexure, removed with a cold snare. Resected and retrieved. - One 3 mm polyp in the descending colon, removed with a cold snare. Resected and retrieved. - One 2 mm polyp in the rectum, removed with a cold snare. Resected and retrieved. - The examined portion of the ileum was normal. - The distal rectum and anal verge are normal on retroflexion view.   03/03/2022 Initial Biopsy   Diagnosis 1. Hepatic Flexure Biopsy, transverse, splenic flexure, descending, rectal, polyps (6) TUBULAR ADENOMAS. NEGATIVE FOR HIGH-GRADE DYSPLASIA. 2. Sigmoid Colon Biopsy INVASIVE ADENOCARCINOMA, MODERATELY DIFFERENTIATED (GRADE 2).   03/17/2022 Imaging   EXAM: CT CHEST, ABDOMEN, AND PELVIS WITH CONTRAST  IMPRESSION: 1. Short segment wall thickening of the descending colon which possibly reflects patient's known primary colonic neoplasm. 2. Prominent mesenteric lymph nodes adjacent to the area of descending colonic wall thickening measure up to 8 mm and likely reflect local nodal involvement. 3. No evidence of distant metastatic disease in the chest, abdomen or pelvis.   04/28/2022 Initial Diagnosis   Colon cancer ([place])   04/28/2022 Cancer Staging   Staging form: Colon and Rectum, AJCC 8th Edition - Pathologic  stage from 04/28/2022: Stage IIIB (pT3, pN1b, cM0) - Signed by Truitt Merle, MD on 05/18/2022 Stage prefix: Initial diagnosis Histologic grading system: 4 grade system Histologic grade (G): G2 Residual tumor (R): R0 - None   04/28/2022 Definitive Surgery   FINAL MICROSCOPIC DIAGNOSIS:   A. COLON, LEFT, RESECTION:  - Invasive  colonic adenocarcinoma, moderately differentiated, 3.3 cm.  - Carcinoma extends into pericolonic connective tissue.  - All surgical margins negative for carcinoma.  - Metastatic carcinoma in three of twenty-three lymph nodes (3/23) with extranodal extension.  - One satellite tumor nodule.  - See oncology table.    06/08/2022 - 06/08/2022 Chemotherapy   Patient is on Treatment Plan : COLORECTAL Xelox (Capeox) q21d     06/08/2022 -  Chemotherapy   Patient is on Treatment Plan : RECTAL Xelox (Capeox) (130/850) q21d x 6 cycles        INTERVAL HISTORY:  Jade Alvarez is here for a follow up of colon cancer. She was last seen by me on 05/26/22 (phone). She presents to the clinic accompanied by her husband. She reports she is feeling well overall. She notes her recent vacation was nice and very needed for her. She denies any side effects from Xeloda for the week she took it.   All other systems were reviewed with the patient and are negative.  MEDICAL HISTORY:  Past Medical History:  Diagnosis Date   Anemia    PAST HX - NO ISSUES SINCE PARTIAL HYSTERECTOMY   Blood transfusion without reported diagnosis    PRIOR TO HYSTERECTOMY   Colon cancer ([place]) 04/28/2022   Hypertension    Positive colorectal cancer screening using Cologuard test 06/24/2021    SURGICAL HISTORY: Past Surgical History:  Procedure Laterality Date   ABDOMINAL HYSTERECTOMY     PARTIAL   CESAREAN SECTION     x2   COLONOSCOPY  03/03/2022   FLEXIBLE SIGMOIDOSCOPY N/A 04/28/2022   Procedure: FLEXIBLE SIGMOIDOSCOPY;  Surgeon: Ileana Roup, MD;  Location: [facility];  Service: General;  Laterality: N/A;   WISDOM TOOTH EXTRACTION      I have reviewed the social history and family history with the patient and they are unchanged from previous note.  ALLERGIES:  is allergic to penicillins.  MEDICATIONS:  Current Outpatient Medications  Medication Sig Dispense Refill   capecitabine (XELODA) 500 MG tablet Take 3  tablets by mouth every 12 hours. Take within 30 minutes after meals. Take for 14 days then off for 7 days. 84 tablet 1   EDARBYCLOR 40-25 MG TABS TAKE 1 TABLET BY MOUTH ONCE DAILY (Patient taking differently: Take 1 tablet by mouth daily.) 90 tablet 0   ondansetron (ZOFRAN) 8 MG tablet Take 1 tablet (8 mg total) by mouth every 8 (eight) hours as needed for nausea or vomiting. Start on the third day after chemotherapy. 30 tablet 1   prochlorperazine (COMPAZINE) 10 MG tablet Take 1 tablet (10 mg total) by mouth every 6 (six) hours as needed for nausea or vomiting. 30 tablet 1   No current facility-administered medications for this visit.    PHYSICAL EXAMINATION: ECOG PERFORMANCE STATUS: 0 - Asymptomatic  There were no vitals filed for this visit. Wt Readings from Last 3 Encounters:  05/18/22 159 lb 1 oz (72.2 kg)  04/30/22 154 lb 5.2 oz (70 kg)  04/21/22 158 lb (71.7 kg)     GENERAL:alert, no distress and comfortable SKIN: skin color normal, no rashes or significant lesions EYES: normal, Conjunctiva are pink and  non-injected, sclera clear  NEURO: alert & oriented x 3 with fluent speech  LABORATORY DATA:  I have reviewed the data as listed    Latest Ref Rng & Units 06/06/2022    3:52 PM 04/30/2022    4:04 AM 04/29/2022    4:37 AM  CBC  WBC 4.0 - 10.5 K/uL 9.3  9.3  9.8   Hemoglobin 12.0 - 15.0 g/dL 12.1  10.3  10.2   Hematocrit 36.0 - 46.0 % 36.1  31.9  31.5   Platelets 150 - 400 K/uL 315  281  287         Latest Ref Rng & Units 06/06/2022    3:52 PM 04/30/2022    4:04 AM 04/29/2022    4:37 AM  CMP  Glucose 70 - 99 mg/dL 93  97  103   BUN 8 - 23 mg/dL '15  14  14   '$ Creatinine 0.44 - 1.00 mg/dL 1.08  1.13  1.07   Sodium 135 - 145 mmol/L 138  136  140   Potassium 3.5 - 5.1 mmol/L 3.7  3.0  3.1   Chloride 98 - 111 mmol/L 101  100  101   CO2 22 - 32 mmol/L 33  29  31   Calcium 8.9 - 10.3 mg/dL 9.7  8.2  8.2   Total Protein 6.5 - 8.1 g/dL 7.6     Total Bilirubin 0.3 - 1.2 mg/dL  0.3     Alkaline Phos 38 - 126 U/L 61     AST 15 - 41 U/L 25     ALT 0 - 44 U/L 20         RADIOGRAPHIC STUDIES: I have personally reviewed the radiological images as listed and agreed with the findings in the report. No results found.    Orders Placed This Encounter  Procedures   CBC with Differential ([place] Only)    Standing Status:   Future    Standing Expiration Date:   06/30/2023   CMP ([place] only)    Standing Status:   Future    Standing Expiration Date:   06/30/2023   All questions were answered. The patient knows to call the clinic with any problems, questions or concerns. No barriers to learning was detected. The total time spent in the appointment was 30 minutes.     Truitt Merle, MD 06/06/2022   I, Wilburn Mylar, am acting as scribe for Truitt Merle, MD.   I have reviewed the above documentation for accuracy and completeness, and I agree with the above.

## 2022-06-07 ENCOUNTER — Telehealth: Payer: Self-pay | Admitting: Pharmacist

## 2022-06-07 ENCOUNTER — Other Ambulatory Visit (HOSPITAL_COMMUNITY): Payer: Self-pay

## 2022-06-07 ENCOUNTER — Encounter: Payer: Self-pay | Admitting: Hematology

## 2022-06-07 DIAGNOSIS — C187 Malignant neoplasm of sigmoid colon: Secondary | ICD-10-CM

## 2022-06-07 LAB — CEA (IN HOUSE-CHCC): CEA (CHCC-In House): 1.03 ng/mL (ref 0.00–5.00)

## 2022-06-07 MED ORDER — CAPECITABINE 500 MG PO TABS: ORAL_TABLET | ORAL | 0 refills | Status: DC

## 2022-06-07 MED FILL — Dexamethasone Sodium Phosphate Inj 100 MG/10ML: INTRAMUSCULAR | Qty: 1 | Status: AC

## 2022-06-07 NOTE — Telephone Encounter (Signed)
Oral Chemotherapy Pharmacist Encounter   Patient's insurance requires that Xeloda (capecitabine) be filled through [facility] for future fills. Patient confirmed she would pay cash price at [facility] for this fill (picked up 06/07/22).  Remaining refill transferred to [facility] for dispensing for subsequent cycle.   Leron Croak, PharmD, BCPS, BCOP Hematology/Oncology Clinical Pharmacist [facility] [facility] [place] [phone] 06/07/2022 1:48 PM

## 2022-06-08 ENCOUNTER — Telehealth: Payer: Self-pay | Admitting: Hematology

## 2022-06-08 ENCOUNTER — Other Ambulatory Visit: Payer: Self-pay

## 2022-06-08 ENCOUNTER — Inpatient Hospital Stay: Payer: No Typology Code available for payment source

## 2022-06-08 VITALS — BP 137/79 | HR 86 | Temp 97.6°F | Resp 18 | Wt 160.5 lb

## 2022-06-08 DIAGNOSIS — C187 Malignant neoplasm of sigmoid colon: Secondary | ICD-10-CM

## 2022-06-08 DIAGNOSIS — Z79899 Other long term (current) drug therapy: Secondary | ICD-10-CM | POA: Diagnosis not present

## 2022-06-08 DIAGNOSIS — Z5111 Encounter for antineoplastic chemotherapy: Secondary | ICD-10-CM | POA: Diagnosis not present

## 2022-06-08 DIAGNOSIS — Z90710 Acquired absence of both cervix and uterus: Secondary | ICD-10-CM | POA: Diagnosis not present

## 2022-06-08 DIAGNOSIS — I10 Essential (primary) hypertension: Secondary | ICD-10-CM | POA: Diagnosis not present

## 2022-06-08 MED ORDER — DEXTROSE 5 % IV SOLN: Freq: Once | INTRAVENOUS | Status: AC

## 2022-06-08 MED ORDER — PALONOSETRON HCL INJECTION 0.25 MG/5ML
0.2500 mg | Freq: Once | INTRAVENOUS | Status: AC
  Administered 2022-06-08: 0.25 mg via INTRAVENOUS
  Filled 2022-06-08: qty 5

## 2022-06-08 MED ORDER — SODIUM CHLORIDE 0.9 % IV SOLN
10.0000 mg | Freq: Once | INTRAVENOUS | Status: AC
  Administered 2022-06-08: 10 mg via INTRAVENOUS
  Filled 2022-06-08: qty 10

## 2022-06-08 MED ORDER — OXALIPLATIN CHEMO INJECTION 100 MG/20ML
130.0000 mg/m2 | Freq: Once | INTRAVENOUS | Status: AC
  Administered 2022-06-08: 220 mg via INTRAVENOUS
  Filled 2022-06-08: qty 40

## 2022-06-08 NOTE — Patient Instructions (Signed)
[facility]  Discharge Instructions: Thank you for choosing [facility] to provide your oncology and hematology care.   If you have a lab appointment with the Cancer Center, please go directly to the Cancer Center and check in at the registration area.   Wear comfortable clothing and clothing appropriate for easy access to any Portacath or PICC line.   We strive to give you quality time with your provider. You may need to reschedule your appointment if you arrive late (15 or more minutes).  Arriving late affects you and other patients whose appointments are after yours.  Also, if you miss three or more appointments without notifying the office, you may be dismissed from the clinic at the provider's discretion.      For prescription refill requests, have your pharmacy contact our office and allow 72 hours for refills to be completed.    Today you received the following chemotherapy and/or immunotherapy agents oxaliplatin      To help prevent nausea and vomiting after your treatment, we encourage you to take your nausea medication as directed.  BELOW ARE SYMPTOMS THAT SHOULD BE REPORTED IMMEDIATELY: *FEVER GREATER THAN 100.4 F (38 C) OR HIGHER *CHILLS OR SWEATING *NAUSEA AND VOMITING THAT IS NOT CONTROLLED WITH YOUR NAUSEA MEDICATION *UNUSUAL SHORTNESS OF BREATH *UNUSUAL BRUISING OR BLEEDING *URINARY PROBLEMS (pain or burning when urinating, or frequent urination) *BOWEL PROBLEMS (unusual diarrhea, constipation, pain near the anus) TENDERNESS IN MOUTH AND THROAT WITH OR WITHOUT PRESENCE OF ULCERS (sore throat, sores in mouth, or a toothache) UNUSUAL RASH, SWELLING OR PAIN  UNUSUAL VAGINAL DISCHARGE OR ITCHING   Items with * indicate a potential emergency and should be followed up as soon as possible or go to the Emergency Department if any problems should occur.  Please show the CHEMOTHERAPY ALERT CARD or IMMUNOTHERAPY ALERT CARD at check-in to  the Emergency Department and triage nurse.  Should you have questions after your visit or need to cancel or reschedule your appointment, please contact [facility]  Dept: [phone]  and follow the prompts.  Office hours are 8:00 a.m. to 4:30 p.m. Monday - Friday. Please note that voicemails left after 4:00 p.m. may not be returned until the following business day.  We are closed weekends and major holidays. You have access to a nurse at all times for urgent questions. Please call the main number to the clinic Dept: [phone] and follow the prompts.   For any non-urgent questions, you may also contact your provider using MyChart. We now offer e-Visits for anyone 18 and older to request care online for non-urgent symptoms. For details visit [email or web address].   Also download the MyChart app! Go to the app store, search "MyChart", open the app, select [facility], and log in with your MyChart username and password.  Masks are optional in the cancer centers. If you would like for your care team to wear a mask while they are taking care of you, please let them know. You may have one support person who is at least 63 years old accompany you for your appointments. 

## 2022-06-08 NOTE — Telephone Encounter (Signed)
Left message with follow-up appointment per 8/22 los. 

## 2022-06-09 ENCOUNTER — Telehealth: Payer: Self-pay

## 2022-06-09 NOTE — Telephone Encounter (Signed)
Jade Alvarez states that she is eating,drinking, and urinating well. She is experiencing the pins and needle sensation in her throat when she ate some sald. Reminded her that she should let the salad and dressing get to room temperature prior to eating. She states that she noticed this am theat she has white spots up and down her legs.  The y are not raised, or itchy.  She is concerned that the 5-U is causing her skin to change color. She wanted to know if this would reverse when her treatment is done as she likes her dark skin. Told her to discuss this with Dr. Burr Medico at her visit on 06-14-22

## 2022-06-09 NOTE — Telephone Encounter (Signed)
-----   Message from Clyda Hurdle, RN sent at 06/08/2022  5:09 PM EDT ----- Regarding: 1st Time Capeox- Dr Burr Medico Dr Ernestina Penna patient 1st Time oxaliplatin follow up

## 2022-06-14 ENCOUNTER — Encounter: Payer: Self-pay | Admitting: Hematology

## 2022-06-14 ENCOUNTER — Inpatient Hospital Stay (HOSPITAL_BASED_OUTPATIENT_CLINIC_OR_DEPARTMENT_OTHER): Payer: No Typology Code available for payment source | Admitting: Hematology

## 2022-06-14 DIAGNOSIS — C187 Malignant neoplasm of sigmoid colon: Secondary | ICD-10-CM

## 2022-06-14 DIAGNOSIS — I10 Essential (primary) hypertension: Secondary | ICD-10-CM | POA: Diagnosis not present

## 2022-06-14 DIAGNOSIS — Z79899 Other long term (current) drug therapy: Secondary | ICD-10-CM | POA: Diagnosis not present

## 2022-06-14 DIAGNOSIS — Z90710 Acquired absence of both cervix and uterus: Secondary | ICD-10-CM | POA: Diagnosis not present

## 2022-06-14 DIAGNOSIS — Z5111 Encounter for antineoplastic chemotherapy: Secondary | ICD-10-CM | POA: Diagnosis not present

## 2022-06-14 NOTE — Progress Notes (Signed)
[place]   Telephone:(336) [phone] Fax:(336) [phone]   Clinic Follow up Note   Patient Care Team: Denita Lung, MD as PCP - General (Family Medicine) Truitt Merle, MD as Consulting Physician (Oncology) Royston Bake, RN as Oncology Nurse Navigator (Oncology)  Date of Service:  06/14/2022  I connected with Jade Alvarez on 06/14/2022 at  8:40 AM EDT by telephone visit and verified that I am speaking with the correct person using two identifiers.  I discussed the limitations, risks, security and privacy concerns of performing an evaluation and management service by telephone and the availability of in person appointments. I also discussed with the patient that there may be a patient responsible charge related to this service. The patient expressed understanding and agreed to proceed.   Other persons participating in the visit and their role in the encounter:  none  Patient's location:  work Provider's location:  my office  CHIEF COMPLAINT: f/u of colon cancer  CURRENT THERAPY:  Adjuvant CAPEOX, starting 06/08/22             -dose: '1500mg'$  BID q12hr  ASSESSMENT & PLAN:  Jade Alvarez is a 61 y.o. female with   1. 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 Colonoscopy on 03/03/22 showed partially obstructing tumor in proximal sigmoid colon, path confirmed adenocarcinoma. -s/p resection 04/28/22, path showed 3.3 cm invasive adenocarcinoma, moderately differentiated, extending into pericolonic tissue. Margins negative, 3/23 positive lymph nodes. -she took one week capecitabine starting 05/22/22, then went on planned vacation. She tolerated well with no noticeable side effects.  -she began oxaliplatin and Xeloda on 06/08/22, plan for 6 cycles (~3 months). She experienced a number of side effects, including cold sensitivity, mouth sensitivity with pain, mild constipation, headaches, and chills. She reports she has most recovered  except for the mouth sensitivity/pain. -she is little reluctant to continue chemo. I recommend she continue treatment, and I will plan to reduce her dose of oxaliplatin with next cycle. She agrees to schedule her next cycle.    2. Chemo Toxicities: cold sensitivity, Mouth sensitivity -I encouraged her to use mouthwash. I reiterated that she can use any non-alcohol-containing mouthwash or a mix of water, salt, and baking soda.  3. HTN -Continue meds      PLAN:  -continue Xeloda at same dose             -Xeloda dose: '1500mg'$  BID q12hr for day 1-14 every 21 days  -lab, f/u, and CAPEOX in 2 weeks, will likely reduce oxaliplatin dose due to poor tolerance    No problem-specific Assessment & Plan notes found for this encounter.   SUMMARY OF ONCOLOGIC HISTORY: Oncology History Overview Note   Cancer Staging  Colon cancer [facility]) Staging form: Colon and Rectum, AJCC 8th Edition - Pathologic stage from 04/28/2022: Stage IIIB (pT3, pN1b, cM0) - Signed by Truitt Merle, MD on 05/18/2022    Colon cancer ([place])  06/23/2021 Miscellaneous   Cologuard - Positive   03/03/2022 Procedure   Colonoscopy, Dr. Candis Schatz  Impression: - Rule out malignancy, partially obstructing tumor in the proximal sigmoid colon. Biopsied. Tattooed. - Two 4 to 7 mm polyps in the transverse colon, removed with a cold snare. Resected and retrieved. - One 4 mm polyp at the hepatic flexure, removed with a cold snare. Resected and retrieved. - One 4 mm polyp at the splenic flexure, removed with a cold snare. Resected and retrieved. - One 3 mm polyp in the  descending colon, removed with a cold snare. Resected and retrieved. - One 2 mm polyp in the rectum, removed with a cold snare. Resected and retrieved. - The examined portion of the ileum was normal. - The distal rectum and anal verge are normal on retroflexion view.   03/03/2022 Initial Biopsy   Diagnosis 1. Hepatic Flexure Biopsy, transverse, splenic flexure, descending,  rectal, polyps (6) TUBULAR ADENOMAS. NEGATIVE FOR HIGH-GRADE DYSPLASIA. 2. Sigmoid Colon Biopsy INVASIVE ADENOCARCINOMA, MODERATELY DIFFERENTIATED (GRADE 2).   03/17/2022 Imaging   EXAM: CT CHEST, ABDOMEN, AND PELVIS WITH CONTRAST  IMPRESSION: 1. Short segment wall thickening of the descending colon which possibly reflects patient's known primary colonic neoplasm. 2. Prominent mesenteric lymph nodes adjacent to the area of descending colonic wall thickening measure up to 8 mm and likely reflect local nodal involvement. 3. No evidence of distant metastatic disease in the chest, abdomen or pelvis.   04/28/2022 Initial Diagnosis   Colon cancer ([place])   04/28/2022 Cancer Staging   Staging form: Colon and Rectum, AJCC 8th Edition - Pathologic stage from 04/28/2022: Stage IIIB (pT3, pN1b, cM0) - Signed by Truitt Merle, MD on 05/18/2022 Stage prefix: Initial diagnosis Histologic grading system: 4 grade system Histologic grade (G): G2 Residual tumor (R): R0 - None   04/28/2022 Definitive Surgery   FINAL MICROSCOPIC DIAGNOSIS:   A. COLON, LEFT, RESECTION:  - Invasive colonic adenocarcinoma, moderately differentiated, 3.3 cm.  - Carcinoma extends into pericolonic connective tissue.  - All surgical margins negative for carcinoma.  - Metastatic carcinoma in three of twenty-three lymph nodes (3/23) with extranodal extension.  - One satellite tumor nodule.  - See oncology table.    06/08/2022 - 06/08/2022 Chemotherapy   Patient is on Treatment Plan : COLORECTAL Xelox (Capeox) q21d     06/08/2022 -  Chemotherapy   Patient is on Treatment Plan : RECTAL Xelox (Capeox) (130/850) q21d x 6 cycles        INTERVAL HISTORY:  Jade Alvarez was contacted for a follow up of colon cancer. She was last seen by me on 06/06/22. She reports she experienced a lot of side effects-- cold sensitivity, mouth sensitivity and pain with eating, mild constipation, headaches, chills. She notes the mouth sensitivity  and pain has not resolved yet.   All other systems were reviewed with the patient and are negative.  MEDICAL HISTORY:  Past Medical History:  Diagnosis Date   Anemia    PAST HX - NO ISSUES SINCE PARTIAL HYSTERECTOMY   Blood transfusion without reported diagnosis    PRIOR TO HYSTERECTOMY   Colon cancer ([place]) 04/28/2022   Hypertension    Positive colorectal cancer screening using Cologuard test 06/24/2021    SURGICAL HISTORY: Past Surgical History:  Procedure Laterality Date   ABDOMINAL HYSTERECTOMY     PARTIAL   CESAREAN SECTION     x2   COLONOSCOPY  03/03/2022   FLEXIBLE SIGMOIDOSCOPY N/A 04/28/2022   Procedure: FLEXIBLE SIGMOIDOSCOPY;  Surgeon: Ileana Roup, MD;  Location: [facility];  Service: General;  Laterality: N/A;   WISDOM TOOTH EXTRACTION      I have reviewed the social history and family history with the patient and they are unchanged from previous note.  ALLERGIES:  is allergic to penicillins.  MEDICATIONS:  Current Outpatient Medications  Medication Sig Dispense Refill   capecitabine (XELODA) 500 MG tablet Take 3 tablets by mouth every 12 hours. Take within 30 minutes after meals. Take for 14 days then off for 7 days. 84 tablet  0   EDARBYCLOR 40-25 MG TABS TAKE 1 TABLET BY MOUTH ONCE DAILY (Patient taking differently: Take 1 tablet by mouth daily.) 90 tablet 0   ondansetron (ZOFRAN) 8 MG tablet Take 1 tablet (8 mg total) by mouth every 8 (eight) hours as needed for nausea or vomiting. Start on the third day after chemotherapy. 30 tablet 1   prochlorperazine (COMPAZINE) 10 MG tablet Take 1 tablet (10 mg total) by mouth every 6 (six) hours as needed for nausea or vomiting. 30 tablet 1   No current facility-administered medications for this visit.    PHYSICAL EXAMINATION: ECOG PERFORMANCE STATUS: 1 - Symptomatic but completely ambulatory  There were no vitals filed for this visit. Wt Readings from Last 3 Encounters:  06/08/22 160 lb 8 oz (72.8 kg)   05/18/22 159 lb 1 oz (72.2 kg)  04/30/22 154 lb 5.2 oz (70 kg)     No vitals taken today, Exam not performed today  LABORATORY DATA:  I have reviewed the data as listed    Latest Ref Rng & Units 06/06/2022    3:52 PM 04/30/2022    4:04 AM 04/29/2022    4:37 AM  CBC  WBC 4.0 - 10.5 K/uL 9.3  9.3  9.8   Hemoglobin 12.0 - 15.0 g/dL 12.1  10.3  10.2   Hematocrit 36.0 - 46.0 % 36.1  31.9  31.5   Platelets 150 - 400 K/uL 315  281  287         Latest Ref Rng & Units 06/06/2022    3:52 PM 04/30/2022    4:04 AM 04/29/2022    4:37 AM  CMP  Glucose 70 - 99 mg/dL 93  97  103   BUN 8 - 23 mg/dL '15  14  14   '$ Creatinine 0.44 - 1.00 mg/dL 1.08  1.13  1.07   Sodium 135 - 145 mmol/L 138  136  140   Potassium 3.5 - 5.1 mmol/L 3.7  3.0  3.1   Chloride 98 - 111 mmol/L 101  100  101   CO2 22 - 32 mmol/L 33  29  31   Calcium 8.9 - 10.3 mg/dL 9.7  8.2  8.2   Total Protein 6.5 - 8.1 g/dL 7.6     Total Bilirubin 0.3 - 1.2 mg/dL 0.3     Alkaline Phos 38 - 126 U/L 61     AST 15 - 41 U/L 25     ALT 0 - 44 U/L 20         RADIOGRAPHIC STUDIES: I have personally reviewed the radiological images as listed and agreed with the findings in the report. No results found.    No orders of the defined types were placed in this encounter.  All questions were answered. The patient knows to call the clinic with any problems, questions or concerns. No barriers to learning was detected. The total time spent in the appointment was 30 minutes.     Truitt Merle, MD 06/14/2022   I, Wilburn Mylar, am acting as scribe for Truitt Merle, MD.   I have reviewed the above documentation for accuracy and completeness, and I agree with the above.

## 2022-06-15 ENCOUNTER — Telehealth: Payer: Self-pay | Admitting: Hematology

## 2022-06-15 NOTE — Telephone Encounter (Signed)
Scheduled follow-up appointment per 8/30 los. Patient is aware. 

## 2022-06-21 ENCOUNTER — Encounter: Payer: Self-pay | Admitting: Internal Medicine

## 2022-06-29 MED FILL — Dexamethasone Sodium Phosphate Inj 100 MG/10ML: INTRAMUSCULAR | Qty: 1 | Status: AC

## 2022-06-30 ENCOUNTER — Inpatient Hospital Stay (HOSPITAL_BASED_OUTPATIENT_CLINIC_OR_DEPARTMENT_OTHER): Payer: 59 | Admitting: Hematology

## 2022-06-30 ENCOUNTER — Encounter: Payer: Self-pay | Admitting: Hematology

## 2022-06-30 ENCOUNTER — Inpatient Hospital Stay: Payer: 59 | Attending: Hematology

## 2022-06-30 ENCOUNTER — Other Ambulatory Visit: Payer: Self-pay

## 2022-06-30 ENCOUNTER — Inpatient Hospital Stay: Payer: 59

## 2022-06-30 VITALS — BP 135/73 | HR 80 | Temp 98.1°F | Resp 17 | Wt 161.0 lb

## 2022-06-30 DIAGNOSIS — R200 Anesthesia of skin: Secondary | ICD-10-CM | POA: Diagnosis not present

## 2022-06-30 DIAGNOSIS — Z5111 Encounter for antineoplastic chemotherapy: Secondary | ICD-10-CM | POA: Insufficient documentation

## 2022-06-30 DIAGNOSIS — C187 Malignant neoplasm of sigmoid colon: Secondary | ICD-10-CM

## 2022-06-30 DIAGNOSIS — R202 Paresthesia of skin: Secondary | ICD-10-CM | POA: Insufficient documentation

## 2022-06-30 DIAGNOSIS — R110 Nausea: Secondary | ICD-10-CM | POA: Diagnosis not present

## 2022-06-30 DIAGNOSIS — K1379 Other lesions of oral mucosa: Secondary | ICD-10-CM | POA: Insufficient documentation

## 2022-06-30 LAB — CBC WITH DIFFERENTIAL (CANCER CENTER ONLY)
Abs Immature Granulocytes: 0.01 10*3/uL (ref 0.00–0.07)
Basophils Absolute: 0 10*3/uL (ref 0.0–0.1)
Basophils Relative: 0 %
Eosinophils Absolute: 0.1 10*3/uL (ref 0.0–0.5)
Eosinophils Relative: 2 %
HCT: 39 % (ref 36.0–46.0)
Hemoglobin: 12.9 g/dL (ref 12.0–15.0)
Immature Granulocytes: 0 %
Lymphocytes Relative: 32 %
Lymphs Abs: 2.3 10*3/uL (ref 0.7–4.0)
MCH: 30.7 pg (ref 26.0–34.0)
MCHC: 33.1 g/dL (ref 30.0–36.0)
MCV: 92.9 fL (ref 80.0–100.0)
Monocytes Absolute: 1 10*3/uL (ref 0.1–1.0)
Monocytes Relative: 14 %
Neutro Abs: 3.7 10*3/uL (ref 1.7–7.7)
Neutrophils Relative %: 52 %
Platelet Count: 308 10*3/uL (ref 150–400)
RBC: 4.2 MIL/uL (ref 3.87–5.11)
RDW: 16.6 % — ABNORMAL HIGH (ref 11.5–15.5)
WBC Count: 7.2 10*3/uL (ref 4.0–10.5)
nRBC: 0 % (ref 0.0–0.2)

## 2022-06-30 LAB — CMP (CANCER CENTER ONLY)
ALT: 55 U/L — ABNORMAL HIGH (ref 0–44)
AST: 34 U/L (ref 15–41)
Albumin: 4 g/dL (ref 3.5–5.0)
Alkaline Phosphatase: 66 U/L (ref 38–126)
Anion gap: 10 (ref 5–15)
BUN: 20 mg/dL (ref 8–23)
CO2: 27 mmol/L (ref 22–32)
Calcium: 9.2 mg/dL (ref 8.9–10.3)
Chloride: 100 mmol/L (ref 98–111)
Creatinine: 0.84 mg/dL (ref 0.44–1.00)
GFR, Estimated: >60 mL/min (ref >60)
Glucose, Bld: 89 mg/dL (ref 70–99)
Potassium: 3.6 mmol/L (ref 3.5–5.1)
Sodium: 137 mmol/L (ref 135–145)
Total Bilirubin: 0.5 mg/dL (ref 0.3–1.2)
Total Protein: 7.9 g/dL (ref 6.5–8.1)

## 2022-06-30 MED ORDER — OXALIPLATIN CHEMO INJECTION 100 MG/20ML
115.0000 mg/m2 | Freq: Once | INTRAVENOUS | Status: AC
  Administered 2022-06-30: 195 mg via INTRAVENOUS
  Filled 2022-06-30: qty 39

## 2022-06-30 MED ORDER — PALONOSETRON HCL INJECTION 0.25 MG/5ML
0.2500 mg | Freq: Once | INTRAVENOUS | Status: AC
  Administered 2022-06-30: 0.25 mg via INTRAVENOUS
  Filled 2022-06-30: qty 5

## 2022-06-30 MED ORDER — DEXTROSE 5 % IV SOLN: Freq: Once | INTRAVENOUS | Status: AC

## 2022-06-30 MED ORDER — SODIUM CHLORIDE 0.9 % IV SOLN
10.0000 mg | Freq: Once | INTRAVENOUS | Status: AC
  Administered 2022-06-30: 10 mg via INTRAVENOUS
  Filled 2022-06-30: qty 10

## 2022-06-30 NOTE — Progress Notes (Signed)
[place]   Telephone:(336) [phone] Fax:(336) [phone]   Clinic Follow up Note   Patient Care Team: Denita Lung, MD as PCP - General (Family Medicine) Truitt Merle, MD as Consulting Physician (Oncology) Royston Bake, RN as Oncology Nurse Navigator (Oncology)  Date of Service:  06/30/2022  CHIEF COMPLAINT: f/u of colon cancer  CURRENT THERAPY:  Adjuvant CAPEOX, starting 06/08/22             -dose: '1500mg'$  BID q12hr  ASSESSMENT & PLAN:  Jade Alvarez is a 61 y.o. female with   1. 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 Colonoscopy on 03/03/22 showed partially obstructing tumor in proximal sigmoid colon, path confirmed adenocarcinoma. -s/p resection 04/28/22, path showed 3.3 cm invasive adenocarcinoma, moderately differentiated, extending into pericolonic tissue. Margins negative, 3/23 positive lymph nodes. -she took one week capecitabine starting 05/22/22, then went on planned vacation. She tolerated well with no noticeable side effects.  -baseline CEA 06/06/22 was WNL. -she began oxaliplatin and Xeloda on 06/08/22, plan for 6 cycles (~3 months). She experienced a number of side effects, including cold sensitivity, mouth sensitivity with pain, mild constipation, headaches, and chills. She finally recovered during her off week. -we will decrease her dose today for better tolerance. I explained I do not want to reduce it too much in order to cure her cancer. She agrees to proceed with lower dose. -lab reviewed, overall WNL. Will proceed as scheduled with reduced dose.   2. Chemo Toxicities: cold sensitivity, Mouth sores -I again encouraged her to use mouthwash (without alcohol) or warm water.     PLAN:  -proceed with C2 oxali today with slightly reduced dose. -continue Xeloda at same dose             -Xeloda dose: '1500mg'$  BID q12hr for day 1-14 every 21 days  -lab, f/u, and CAPEOX in 3 and 6 weeks  -prefers Friday  appointments   No problem-specific Assessment & Plan notes found for this encounter.   SUMMARY OF ONCOLOGIC HISTORY: Oncology History Overview Note   Cancer Staging  Colon cancer [facility]) Staging form: Colon and Rectum, AJCC 8th Edition - Pathologic stage from 04/28/2022: Stage IIIB (pT3, pN1b, cM0) - Signed by Truitt Merle, MD on 05/18/2022    Colon cancer ([place])  06/23/2021 Miscellaneous   Cologuard - Positive   03/03/2022 Procedure   Colonoscopy, Dr. Candis Schatz  Impression: - Rule out malignancy, partially obstructing tumor in the proximal sigmoid colon. Biopsied. Tattooed. - Two 4 to 7 mm polyps in the transverse colon, removed with a cold snare. Resected and retrieved. - One 4 mm polyp at the hepatic flexure, removed with a cold snare. Resected and retrieved. - One 4 mm polyp at the splenic flexure, removed with a cold snare. Resected and retrieved. - One 3 mm polyp in the descending colon, removed with a cold snare. Resected and retrieved. - One 2 mm polyp in the rectum, removed with a cold snare. Resected and retrieved. - The examined portion of the ileum was normal. - The distal rectum and anal verge are normal on retroflexion view.   03/03/2022 Initial Biopsy   Diagnosis 1. Hepatic Flexure Biopsy, transverse, splenic flexure, descending, rectal, polyps (6) TUBULAR ADENOMAS. NEGATIVE FOR HIGH-GRADE DYSPLASIA. 2. Sigmoid Colon Biopsy INVASIVE ADENOCARCINOMA, MODERATELY DIFFERENTIATED (GRADE 2).   03/17/2022 Imaging   EXAM: CT CHEST, ABDOMEN, AND PELVIS WITH CONTRAST  IMPRESSION: 1. Short segment wall thickening of the descending colon which possibly  reflects patient's known primary colonic neoplasm. 2. Prominent mesenteric lymph nodes adjacent to the area of descending colonic wall thickening measure up to 8 mm and likely reflect local nodal involvement. 3. No evidence of distant metastatic disease in the chest, abdomen or pelvis.   04/28/2022 Initial Diagnosis   Colon  cancer ([place])   04/28/2022 Cancer Staging   Staging form: Colon and Rectum, AJCC 8th Edition - Pathologic stage from 04/28/2022: Stage IIIB (pT3, pN1b, cM0) - Signed by Truitt Merle, MD on 05/18/2022 Stage prefix: Initial diagnosis Histologic grading system: 4 grade system Histologic grade (G): G2 Residual tumor (R): R0 - None   04/28/2022 Definitive Surgery   FINAL MICROSCOPIC DIAGNOSIS:   A. COLON, LEFT, RESECTION:  - Invasive colonic adenocarcinoma, moderately differentiated, 3.3 cm.  - Carcinoma extends into pericolonic connective tissue.  - All surgical margins negative for carcinoma.  - Metastatic carcinoma in three of twenty-three lymph nodes (3/23) with extranodal extension.  - One satellite tumor nodule.  - See oncology table.    06/08/2022 - 06/08/2022 Chemotherapy   Patient is on Treatment Plan : COLORECTAL Xelox (Capeox) q21d     06/08/2022 -  Chemotherapy   Patient is on Treatment Plan : RECTAL Xelox (Capeox) (130/850) q21d x 6 cycles        INTERVAL HISTORY:  Jade Alvarez is here for a follow up of colon cancer. She was last seen by me on 06/14/22. She presents to the clinic accompanied by her husband. She reports her mouth sores and cold sensitivity lasted 2 weeks and finally resolved in her off week. She reports some numbness in her fingers and toes with tingling in her fingers. She notes she had only mild nausea.   All other systems were reviewed with the patient and are negative.  MEDICAL HISTORY:  Past Medical History:  Diagnosis Date   Anemia    PAST HX - NO ISSUES SINCE PARTIAL HYSTERECTOMY   Blood transfusion without reported diagnosis    PRIOR TO HYSTERECTOMY   Colon cancer ([place]) 04/28/2022   Hypertension    Positive colorectal cancer screening using Cologuard test 06/24/2021    SURGICAL HISTORY: Past Surgical History:  Procedure Laterality Date   ABDOMINAL HYSTERECTOMY     PARTIAL   CESAREAN SECTION     x2   COLONOSCOPY  03/03/2022   FLEXIBLE  SIGMOIDOSCOPY N/A 04/28/2022   Procedure: FLEXIBLE SIGMOIDOSCOPY;  Surgeon: Ileana Roup, MD;  Location: [facility];  Service: General;  Laterality: N/A;   WISDOM TOOTH EXTRACTION      I have reviewed the social history and family history with the patient and they are unchanged from previous note.  ALLERGIES:  is allergic to penicillins.  MEDICATIONS:  Current Outpatient Medications  Medication Sig Dispense Refill   capecitabine (XELODA) 500 MG tablet Take 3 tablets by mouth every 12 hours. Take within 30 minutes after meals. Take for 14 days then off for 7 days. 84 tablet 0   EDARBYCLOR 40-25 MG TABS TAKE 1 TABLET BY MOUTH ONCE DAILY (Patient taking differently: Take 1 tablet by mouth daily.) 90 tablet 0   ondansetron (ZOFRAN) 8 MG tablet Take 1 tablet (8 mg total) by mouth every 8 (eight) hours as needed for nausea or vomiting. Start on the third day after chemotherapy. 30 tablet 1   prochlorperazine (COMPAZINE) 10 MG tablet Take 1 tablet (10 mg total) by mouth every 6 (six) hours as needed for nausea or vomiting. 30 tablet 1   No current  facility-administered medications for this visit.    PHYSICAL EXAMINATION: ECOG PERFORMANCE STATUS: 1 - Symptomatic but completely ambulatory  There were no vitals filed for this visit. Wt Readings from Last 3 Encounters:  06/30/22 161 lb (73 kg)  06/08/22 160 lb 8 oz (72.8 kg)  05/18/22 159 lb 1 oz (72.2 kg)     GENERAL:alert, no distress and comfortable SKIN: skin color normal, no rashes or significant lesions EYES: normal, Conjunctiva are pink and non-injected, sclera clear  NEURO: alert & oriented x 3 with fluent speech  LABORATORY DATA:  I have reviewed the data as listed    Latest Ref Rng & Units 06/30/2022   12:10 PM 06/06/2022    3:52 PM 04/30/2022    4:04 AM  CBC  WBC 4.0 - 10.5 K/uL 7.2  9.3  9.3   Hemoglobin 12.0 - 15.0 g/dL 12.9  12.1  10.3   Hematocrit 36.0 - 46.0 % 39.0  36.1  31.9   Platelets 150 - 400 K/uL 308  315   281         Latest Ref Rng & Units 06/30/2022   12:10 PM 06/06/2022    3:52 PM 04/30/2022    4:04 AM  CMP  Glucose 70 - 99 mg/dL 89  93  97   BUN 8 - 23 mg/dL '20  15  14   '$ Creatinine 0.44 - 1.00 mg/dL 0.84  1.08  1.13   Sodium 135 - 145 mmol/L 137  138  136   Potassium 3.5 - 5.1 mmol/L 3.6  3.7  3.0   Chloride 98 - 111 mmol/L 100  101  100   CO2 22 - 32 mmol/L 27  33  29   Calcium 8.9 - 10.3 mg/dL 9.2  9.7  8.2   Total Protein 6.5 - 8.1 g/dL 7.9  7.6    Total Bilirubin 0.3 - 1.2 mg/dL 0.5  0.3    Alkaline Phos 38 - 126 U/L 66  61    AST 15 - 41 U/L 34  25    ALT 0 - 44 U/L 55  20        RADIOGRAPHIC STUDIES: I have personally reviewed the radiological images as listed and agreed with the findings in the report. No results found.    Orders Placed This Encounter  Procedures   CBC with Differential ([place] Only)    Standing Status:   Future    Standing Expiration Date:   07/21/2023   CMP ([place] only)    Standing Status:   Future    Standing Expiration Date:   07/21/2023   CBC with Differential ([place] Only)    Standing Status:   Future    Standing Expiration Date:   08/11/2023   CMP ([place] only)    Standing Status:   Future    Standing Expiration Date:   08/11/2023   All questions were answered. The patient knows to call the clinic with any problems, questions or concerns. No barriers to learning was detected. The total time spent in the appointment was 30 minutes.     Truitt Merle, MD 06/30/2022   I, Wilburn Mylar, am acting as scribe for Truitt Merle, MD.   I have reviewed the above documentation for accuracy and completeness, and I agree with the above.

## 2022-06-30 NOTE — Patient Instructions (Signed)
[facility]ICAL [facility]COL[facility]  Discharge Instructions: Thank you for choosing [facility] to provide your oncology and hematology care.   If you have a lab appointment with the Cancer Center, please go directly to the Cancer Center and check in at the registration area.   Wear comfortable clothing and clothing appropriate for easy access to any Portacath or PICC line.   We strive to give you quality time with your provider. You may need to reschedule your appointment if you arrive late (15 or more minutes).  Arriving late affects you and other patients whose appointments are after yours.  Also, if you miss three or more appointments without notifying the office, you may be dismissed from the clinic at the provider's discretion.      For prescription refill requests, have your pharmacy contact our office and allow 72 hours for refills to be completed.    Today you received the following chemotherapy and/or immunotherapy agents: Oxaliplatin      To help prevent nausea and vomiting after your treatment, we encourage you to take your nausea medication as directed.  BELOW ARE SYMPTOMS THAT SHOULD BE REPORTED IMMEDIATELY: *FEVER GREATER THAN 100.4 F (38 C) OR HIGHER *CHILLS OR SWEATING *NAUSEA AND VOMITING THAT IS NOT CONTROLLED WITH YOUR NAUSEA MEDICATION *UNUSUAL SHORTNESS OF BREATH *UNUSUAL BRUISING OR BLEEDING *URINARY PROBLEMS (pain or burning when urinating, or frequent urination) *BOWEL PROBLEMS (unusual diarrhea, constipation, pain near the anus) TENDERNESS IN MOUTH AND THROAT WITH OR WITHOUT PRESENCE OF ULCERS (sore throat, sores in mouth, or a toothache) UNUSUAL RASH, SWELLING OR PAIN  UNUSUAL VAGINAL DISCHARGE OR ITCHING   Items with * indicate a potential emergency and should be followed up as soon as possible or go to the Emergency Department if any problems should occur.  Please show the CHEMOTHERAPY ALERT CARD or IMMUNOTHERAPY ALERT CARD at check-in  to the Emergency Department and triage nurse.  Should you have questions after your visit or need to cancel or reschedule your appointment, please contact [facility]  Dept: [phone]  and follow the prompts.  Office hours are 8:00 a.m. to 4:30 p.m. Monday - Friday. Please note that voicemails left after 4:00 p.m. may not be returned until the following business day.  We are closed weekends and major holidays. You have access to a nurse at all times for urgent questions. Please call the main number to the clinic Dept: [phone] and follow the prompts.   For any non-urgent questions, you may also contact your provider using MyChart. We now offer e-Visits for anyone 18 and older to request care online for non-urgent symptoms. For details visit [email or web address].   Also download the MyChart app! Go to the app store, search "MyChart", open the app, select [facility], and log in with your MyChart username and password.  Masks are optional in the cancer centers. If you would like for your care team to wear a mask while they are taking care of you, please let them know. You may have one support person who is at least 63 years old accompany you for your appointments. 

## 2022-07-05 ENCOUNTER — Other Ambulatory Visit: Payer: Self-pay | Admitting: Hematology

## 2022-07-05 DIAGNOSIS — C187 Malignant neoplasm of sigmoid colon: Secondary | ICD-10-CM

## 2022-07-18 ENCOUNTER — Other Ambulatory Visit: Payer: Self-pay

## 2022-07-20 ENCOUNTER — Encounter: Payer: Self-pay | Admitting: Hematology

## 2022-07-20 MED FILL — Dexamethasone Sodium Phosphate Inj 100 MG/10ML: INTRAMUSCULAR | Qty: 1 | Status: AC

## 2022-07-20 NOTE — Progress Notes (Signed)
Called pt to introduce myself as her Financial Resource Specialist and to discuss the [employer] grant.  I left a msg requesting she return my call if she's interested in applying for the grant.  ?

## 2022-07-21 ENCOUNTER — Inpatient Hospital Stay: Payer: No Typology Code available for payment source | Attending: Hematology

## 2022-07-21 ENCOUNTER — Inpatient Hospital Stay (HOSPITAL_BASED_OUTPATIENT_CLINIC_OR_DEPARTMENT_OTHER): Payer: No Typology Code available for payment source | Admitting: Hematology

## 2022-07-21 ENCOUNTER — Other Ambulatory Visit: Payer: Self-pay

## 2022-07-21 ENCOUNTER — Inpatient Hospital Stay: Payer: No Typology Code available for payment source

## 2022-07-21 VITALS — BP 155/97 | HR 77 | Temp 97.9°F | Resp 18 | Wt 164.0 lb

## 2022-07-21 DIAGNOSIS — Z5111 Encounter for antineoplastic chemotherapy: Secondary | ICD-10-CM | POA: Insufficient documentation

## 2022-07-21 DIAGNOSIS — C187 Malignant neoplasm of sigmoid colon: Secondary | ICD-10-CM | POA: Insufficient documentation

## 2022-07-21 LAB — CBC WITH DIFFERENTIAL (CANCER CENTER ONLY)
Abs Immature Granulocytes: 0.02 10*3/uL (ref 0.00–0.07)
Basophils Absolute: 0 10*3/uL (ref 0.0–0.1)
Basophils Relative: 0 %
Eosinophils Absolute: 0.1 10*3/uL (ref 0.0–0.5)
Eosinophils Relative: 2 %
HCT: 36 % (ref 36.0–46.0)
Hemoglobin: 12 g/dL (ref 12.0–15.0)
Immature Granulocytes: 0 %
Lymphocytes Relative: 35 %
Lymphs Abs: 2.2 10*3/uL (ref 0.7–4.0)
MCH: 31.3 pg (ref 26.0–34.0)
MCHC: 33.3 g/dL (ref 30.0–36.0)
MCV: 93.8 fL (ref 80.0–100.0)
Monocytes Absolute: 1.2 10*3/uL — ABNORMAL HIGH (ref 0.1–1.0)
Monocytes Relative: 18 %
Neutro Abs: 2.7 10*3/uL (ref 1.7–7.7)
Neutrophils Relative %: 45 %
Platelet Count: 308 10*3/uL (ref 150–400)
RBC: 3.84 MIL/uL — ABNORMAL LOW (ref 3.87–5.11)
RDW: 18.3 % — ABNORMAL HIGH (ref 11.5–15.5)
WBC Count: 6.3 10*3/uL (ref 4.0–10.5)
nRBC: 0 % (ref 0.0–0.2)

## 2022-07-21 LAB — CMP (CANCER CENTER ONLY)
ALT: 33 U/L (ref 0–44)
AST: 30 U/L (ref 15–41)
Albumin: 4.2 g/dL (ref 3.5–5.0)
Alkaline Phosphatase: 70 U/L (ref 38–126)
Anion gap: 6 (ref 5–15)
BUN: 16 mg/dL (ref 8–23)
CO2: 30 mmol/L (ref 22–32)
Calcium: 8.9 mg/dL (ref 8.9–10.3)
Chloride: 100 mmol/L (ref 98–111)
Creatinine: 0.93 mg/dL (ref 0.44–1.00)
GFR, Estimated: >60 mL/min (ref >60)
Glucose, Bld: 86 mg/dL (ref 70–99)
Potassium: 3.7 mmol/L (ref 3.5–5.1)
Sodium: 136 mmol/L (ref 135–145)
Total Bilirubin: 0.3 mg/dL (ref 0.3–1.2)
Total Protein: 8 g/dL (ref 6.5–8.1)

## 2022-07-21 LAB — CEA (IN HOUSE-CHCC): CEA (CHCC-In House): 1.16 ng/mL (ref 0.00–5.00)

## 2022-07-21 MED ORDER — DEXTROSE 5 % IV SOLN: Freq: Once | INTRAVENOUS | Status: AC

## 2022-07-21 MED ORDER — PALONOSETRON HCL INJECTION 0.25 MG/5ML
0.2500 mg | Freq: Once | INTRAVENOUS | Status: AC
  Administered 2022-07-21: 0.25 mg via INTRAVENOUS
  Filled 2022-07-21: qty 5

## 2022-07-21 MED ORDER — XELODA 500 MG PO TABS: ORAL_TABLET | ORAL | 0 refills | Status: DC

## 2022-07-21 MED ORDER — SODIUM CHLORIDE 0.9 % IV SOLN
10.0000 mg | Freq: Once | INTRAVENOUS | Status: AC
  Administered 2022-07-21: 10 mg via INTRAVENOUS
  Filled 2022-07-21: qty 10

## 2022-07-21 MED ORDER — OXALIPLATIN CHEMO INJECTION 100 MG/20ML
115.0000 mg/m2 | Freq: Once | INTRAVENOUS | Status: AC
  Administered 2022-07-21: 195 mg via INTRAVENOUS
  Filled 2022-07-21: qty 39

## 2022-07-21 NOTE — Patient Instructions (Signed)
[facility]  Discharge Instructions: Thank you for choosing [facility] to provide your oncology and hematology care.   If you have a lab appointment with the Cancer Center, please go directly to the Cancer Center and check in at the registration area.   Wear comfortable clothing and clothing appropriate for easy access to any Portacath or PICC line.   We strive to give you quality time with your provider. You may need to reschedule your appointment if you arrive late (15 or more minutes).  Arriving late affects you and other patients whose appointments are after yours.  Also, if you miss three or more appointments without notifying the office, you may be dismissed from the clinic at the provider's discretion.      For prescription refill requests, have your pharmacy contact our office and allow 72 hours for refills to be completed.    Today you received the following chemotherapy and/or immunotherapy agents oxaliplatin      To help prevent nausea and vomiting after your treatment, we encourage you to take your nausea medication as directed.  BELOW ARE SYMPTOMS THAT SHOULD BE REPORTED IMMEDIATELY: *FEVER GREATER THAN 100.4 F (38 C) OR HIGHER *CHILLS OR SWEATING *NAUSEA AND VOMITING THAT IS NOT CONTROLLED WITH YOUR NAUSEA MEDICATION *UNUSUAL SHORTNESS OF BREATH *UNUSUAL BRUISING OR BLEEDING *URINARY PROBLEMS (pain or burning when urinating, or frequent urination) *BOWEL PROBLEMS (unusual diarrhea, constipation, pain near the anus) TENDERNESS IN MOUTH AND THROAT WITH OR WITHOUT PRESENCE OF ULCERS (sore throat, sores in mouth, or a toothache) UNUSUAL RASH, SWELLING OR PAIN  UNUSUAL VAGINAL DISCHARGE OR ITCHING   Items with * indicate a potential emergency and should be followed up as soon as possible or go to the Emergency Department if any problems should occur.  Please show the CHEMOTHERAPY ALERT CARD or IMMUNOTHERAPY ALERT CARD at check-in to  the Emergency Department and triage nurse.  Should you have questions after your visit or need to cancel or reschedule your appointment, please contact [facility]  Dept: [phone]  and follow the prompts.  Office hours are 8:00 a.m. to 4:30 p.m. Monday - Friday. Please note that voicemails left after 4:00 p.m. may not be returned until the following business day.  We are closed weekends and major holidays. You have access to a nurse at all times for urgent questions. Please call the main number to the clinic Dept: [phone] and follow the prompts.   For any non-urgent questions, you may also contact your provider using MyChart. We now offer e-Visits for anyone 18 and older to request care online for non-urgent symptoms. For details visit [email or web address].   Also download the MyChart app! Go to the app store, search "MyChart", open the app, select , and log in with your MyChart username and password.  Masks are optional in the cancer centers. If you would like for your care team to wear a mask while they are taking care of you, please let them know. You may have one support person who is at least 63 years old accompany you for your appointments. 

## 2022-07-21 NOTE — Progress Notes (Signed)
[facility]   Telephone:(336) [phone] Fax:([phone]) [phone]   Clinic Follow up Note   Patient Care Team: Denita Lung, MD as PCP - General (Family Medicine) Truitt Merle, MD as Consulting Physician (Oncology) Royston Bake, RN as Oncology Nurse Navigator (Oncology)  Date of Service:  07/21/2022  CHIEF COMPLAINT: f/u of colon cancer  CURRENT THERAPY:  Adjuvant CAPEOX, starting 06/08/22             -dose: '1500mg'$  BID q12hr  ASSESSMENT & PLAN:  Jade Alvarez is a 60 y.o. female with   1. 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 Colonoscopy on 03/03/22 showed partially obstructing tumor in proximal sigmoid colon, path confirmed adenocarcinoma. -s/p resection 04/28/22, path showed 3.3 cm invasive adenocarcinoma, moderately differentiated, extending into pericolonic tissue. Margins negative, 3/23 positive lymph nodes. -she took one week capecitabine starting 05/22/22, then went on planned vacation. She tolerated well with no noticeable side effects.  -baseline CEA 06/06/22 was WNL. -she began oxaliplatin and Xeloda on 06/08/22, plan for 4 cycles. She experienced a number of side effects, including cold sensitivity, mouth sensitivity with pain, mild constipation, headaches, and chills. She finally recovered during her off week. -she reports she tolerated better with further reduced dose but still took 2 weeks to recover. -lab reviewed, overall WNL. Will proceed as scheduled with reduced dose.     PLAN:  -proceed with C3 oxali today with same reduced dose. -continue Xeloda at same dose             -Xeloda dose: '1500mg'$  BID q12hr for day 1-14 every 21 days  -lab, f/u, and CAPEOX in 3 and 6 weeks             -prefers Friday appointments   No problem-specific Assessment & Plan notes found for this encounter.   SUMMARY OF ONCOLOGIC HISTORY: Oncology History Overview Note   Cancer Staging  Colon cancer [facility]) Staging form: Colon and  Rectum, AJCC 8th Edition - Pathologic stage from 04/28/2022: Stage IIIB (pT3, pN1b, cM0) - Signed by Truitt Merle, MD on 05/18/2022    Colon cancer ([facility])  06/23/2021 Miscellaneous   Cologuard - Positive   03/03/2022 Procedure   Colonoscopy, Dr. Candis Schatz  Impression: - Rule out malignancy, partially obstructing tumor in the proximal sigmoid colon. Biopsied. Tattooed. - Two 4 to 7 mm polyps in the transverse colon, removed with a cold snare. Resected and retrieved. - One 4 mm polyp at the hepatic flexure, removed with a cold snare. Resected and retrieved. - One 4 mm polyp at the splenic flexure, removed with a cold snare. Resected and retrieved. - One 3 mm polyp in the descending colon, removed with a cold snare. Resected and retrieved. - One 2 mm polyp in the rectum, removed with a cold snare. Resected and retrieved. - The examined portion of the ileum was normal. - The distal rectum and anal verge are normal on retroflexion view.   03/03/2022 Initial Biopsy   Diagnosis 1. Hepatic Flexure Biopsy, transverse, splenic flexure, descending, rectal, polyps (6) TUBULAR ADENOMAS. NEGATIVE FOR HIGH-GRADE DYSPLASIA. 2. Sigmoid Colon Biopsy INVASIVE ADENOCARCINOMA, MODERATELY DIFFERENTIATED (GRADE 2).   03/17/2022 Imaging   EXAM: CT CHEST, ABDOMEN, AND PELVIS WITH CONTRAST  IMPRESSION: 1. Short segment wall thickening of the descending colon which possibly reflects patient's known primary colonic neoplasm. 2. Prominent mesenteric lymph nodes adjacent to the area of descending colonic wall thickening measure up to 8 mm and likely reflect local  nodal involvement. 3. No evidence of distant metastatic disease in the chest, abdomen or pelvis.   04/28/2022 Initial Diagnosis   Colon cancer ([facility])   04/28/2022 Cancer Staging   Staging form: Colon and Rectum, AJCC 8th Edition - Pathologic stage from 04/28/2022: Stage IIIB (pT3, pN1b, cM0) - Signed by Truitt Merle, MD on 05/18/2022 Stage prefix: Initial  diagnosis Histologic grading system: 4 grade system Histologic grade (G): G2 Residual tumor (R): R0 - None   04/28/2022 Definitive Surgery   FINAL MICROSCOPIC DIAGNOSIS:   A. COLON, LEFT, RESECTION:  - Invasive colonic adenocarcinoma, moderately differentiated, 3.3 cm.  - Carcinoma extends into pericolonic connective tissue.  - All surgical margins negative for carcinoma.  - Metastatic carcinoma in three of twenty-three lymph nodes (3/23) with extranodal extension.  - One satellite tumor nodule.  - See oncology table.    06/08/2022 - 06/08/2022 Chemotherapy   Patient is on Treatment Plan : COLORECTAL Xelox (Capeox) q21d     06/08/2022 -  Chemotherapy   Patient is on Treatment Plan : RECTAL Xelox (Capeox) (130/850) q21d x 6 cycles        INTERVAL HISTORY:  Jade Alvarez is here for a follow up of colon cancer. She was last seen by me on 06/30/22. She presents to the clinic accompanied by her husband. She reports she did better with the further reduced dose. She notes it still took 2 weeks to recover. She reports numbness/tingling in her feet that has resolved.   All other systems were reviewed with the patient and are negative.  MEDICAL HISTORY:  Past Medical History:  Diagnosis Date   Anemia    PAST HX - NO ISSUES SINCE PARTIAL HYSTERECTOMY   Blood transfusion without reported diagnosis    PRIOR TO HYSTERECTOMY   Colon cancer ([facility]) 04/28/2022   Hypertension    Positive colorectal cancer screening using Cologuard test 06/24/2021    SURGICAL HISTORY: Past Surgical History:  Procedure Laterality Date   ABDOMINAL HYSTERECTOMY     PARTIAL   CESAREAN SECTION     x2   COLONOSCOPY  03/03/2022   FLEXIBLE SIGMOIDOSCOPY N/A 04/28/2022   Procedure: FLEXIBLE SIGMOIDOSCOPY;  Surgeon: Ileana Roup, MD;  Location: [facility];  Service: General;  Laterality: N/A;   WISDOM TOOTH EXTRACTION      I have reviewed the social history and family history with the patient and they are  unchanged from previous note.  ALLERGIES:  is allergic to penicillins.  MEDICATIONS:  Current Outpatient Medications  Medication Sig Dispense Refill   EDARBYCLOR 40-25 MG TABS TAKE 1 TABLET BY MOUTH ONCE DAILY (Patient taking differently: Take 1 tablet by mouth daily.) 90 tablet 0   ondansetron (ZOFRAN) 8 MG tablet Take 1 tablet (8 mg total) by mouth every 8 (eight) hours as needed for nausea or vomiting. Start on the third day after chemotherapy. 30 tablet 1   prochlorperazine (COMPAZINE) 10 MG tablet Take 1 tablet (10 mg total) by mouth every 6 (six) hours as needed for nausea or vomiting. 30 tablet 1   XELODA 500 MG tablet TAKE 3 TABLETS BY MOUTH EVERY 12 HOURS FOR 14 DAYS, THEN OFF FOR 7 DAYS. TAKE WITHIN 30 MINUTES AFTER MEALS. 84 tablet 0   No current facility-administered medications for this visit.   Facility-Administered Medications Ordered in Other Visits  Medication Dose Route Frequency Provider Last Rate Last Admin   oxaliplatin (ELOXATIN) 195 mg in dextrose 5 % 500 mL chemo infusion  115 mg/m2 (Order-Specific) Intravenous  Once Truitt Merle, MD 270 mL/hr at 07/21/22 1421 195 mg at 07/21/22 1421    PHYSICAL EXAMINATION: ECOG PERFORMANCE STATUS: 1 - Symptomatic but completely ambulatory  There were no vitals filed for this visit. Wt Readings from Last 3 Encounters:  07/21/22 164 lb (74.4 kg)  06/30/22 161 lb (73 kg)  06/08/22 160 lb 8 oz (72.8 kg)     GENERAL:alert, no distress and comfortable SKIN: skin color normal, no rashes or significant lesions EYES: normal, Conjunctiva are pink and non-injected, sclera clear  NEURO: alert & oriented x 3 with fluent speech  LABORATORY DATA:  I have reviewed the data as listed    Latest Ref Rng & Units 07/21/2022   11:49 AM 06/30/2022   12:10 PM 06/06/2022    3:52 PM  CBC  WBC 4.0 - 10.5 K/uL 6.3  7.2  9.3   Hemoglobin 12.0 - 15.0 g/dL 12.0  12.9  12.1   Hematocrit 36.0 - 46.0 % 36.0  39.0  36.1   Platelets 150 - 400 K/uL 308   308  315         Latest Ref Rng & Units 07/21/2022   11:49 AM 06/30/2022   12:10 PM 06/06/2022    3:52 PM  CMP  Glucose 70 - 99 mg/dL 86  89  93   BUN 8 - 23 mg/dL '16  20  15   '$ Creatinine 0.44 - 1.00 mg/dL 0.93  0.84  1.08   Sodium 135 - 145 mmol/L 136  137  138   Potassium 3.5 - 5.1 mmol/L 3.7  3.6  3.7   Chloride 98 - 111 mmol/L 100  100  101   CO2 22 - 32 mmol/L 30  27  33   Calcium 8.9 - 10.3 mg/dL 8.9  9.2  9.7   Total Protein 6.5 - 8.1 g/dL 8.0  7.9  7.6   Total Bilirubin 0.3 - 1.2 mg/dL 0.3  0.5  0.3   Alkaline Phos 38 - 126 U/L 70  66  61   AST 15 - 41 U/L 30  34  25   ALT 0 - 44 U/L 33  55  20       RADIOGRAPHIC STUDIES: I have personally reviewed the radiological images as listed and agreed with the findings in the report. No results found.    No orders of the defined types were placed in this encounter.  All questions were answered. The patient knows to call the clinic with any problems, questions or concerns. No barriers to learning was detected. The total time spent in the appointment was 30 minutes.     Truitt Merle, MD 07/21/2022   I, Wilburn Mylar, am acting as scribe for Truitt Merle, MD.   I have reviewed the above documentation for accuracy and completeness, and I agree with the above.

## 2022-07-22 DIAGNOSIS — Z880 Allergy status to penicillin: Secondary | ICD-10-CM | POA: Diagnosis not present

## 2022-07-22 DIAGNOSIS — I10 Essential (primary) hypertension: Secondary | ICD-10-CM | POA: Diagnosis not present

## 2022-07-22 DIAGNOSIS — Z809 Family history of malignant neoplasm, unspecified: Secondary | ICD-10-CM | POA: Diagnosis not present

## 2022-07-22 DIAGNOSIS — C189 Malignant neoplasm of colon, unspecified: Secondary | ICD-10-CM | POA: Diagnosis not present

## 2022-07-22 DIAGNOSIS — Z833 Family history of diabetes mellitus: Secondary | ICD-10-CM | POA: Diagnosis not present

## 2022-07-25 ENCOUNTER — Encounter: Payer: Self-pay | Admitting: Internal Medicine

## 2022-08-07 ENCOUNTER — Encounter: Payer: Self-pay | Admitting: Internal Medicine

## 2022-08-10 MED FILL — Dexamethasone Sodium Phosphate Inj 100 MG/10ML: INTRAMUSCULAR | Qty: 1 | Status: AC

## 2022-08-11 ENCOUNTER — Inpatient Hospital Stay (HOSPITAL_BASED_OUTPATIENT_CLINIC_OR_DEPARTMENT_OTHER): Payer: No Typology Code available for payment source | Admitting: Hematology

## 2022-08-11 ENCOUNTER — Encounter: Payer: Self-pay | Admitting: Hematology

## 2022-08-11 ENCOUNTER — Other Ambulatory Visit: Payer: Self-pay

## 2022-08-11 ENCOUNTER — Inpatient Hospital Stay: Payer: No Typology Code available for payment source

## 2022-08-11 VITALS — BP 146/78 | HR 90 | Temp 98.5°F | Resp 18 | Ht 62.0 in | Wt 162.3 lb

## 2022-08-11 DIAGNOSIS — Z5111 Encounter for antineoplastic chemotherapy: Secondary | ICD-10-CM | POA: Diagnosis not present

## 2022-08-11 DIAGNOSIS — C187 Malignant neoplasm of sigmoid colon: Secondary | ICD-10-CM

## 2022-08-11 LAB — CBC WITH DIFFERENTIAL (CANCER CENTER ONLY)
Abs Immature Granulocytes: 0.01 10*3/uL (ref 0.00–0.07)
Basophils Absolute: 0 10*3/uL (ref 0.0–0.1)
Basophils Relative: 1 %
Eosinophils Absolute: 0.1 10*3/uL (ref 0.0–0.5)
Eosinophils Relative: 2 %
HCT: 38.1 % (ref 36.0–46.0)
Hemoglobin: 12.6 g/dL (ref 12.0–15.0)
Immature Granulocytes: 0 %
Lymphocytes Relative: 32 %
Lymphs Abs: 2.1 10*3/uL (ref 0.7–4.0)
MCH: 31.3 pg (ref 26.0–34.0)
MCHC: 33.1 g/dL (ref 30.0–36.0)
MCV: 94.8 fL (ref 80.0–100.0)
Monocytes Absolute: 0.9 10*3/uL (ref 0.1–1.0)
Monocytes Relative: 13 %
Neutro Abs: 3.5 10*3/uL (ref 1.7–7.7)
Neutrophils Relative %: 52 %
Platelet Count: 367 10*3/uL (ref 150–400)
RBC: 4.02 MIL/uL (ref 3.87–5.11)
RDW: 17.3 % — ABNORMAL HIGH (ref 11.5–15.5)
WBC Count: 6.6 10*3/uL (ref 4.0–10.5)
nRBC: 0 % (ref 0.0–0.2)

## 2022-08-11 LAB — CMP (CANCER CENTER ONLY)
ALT: 23 U/L (ref 0–44)
AST: 25 U/L (ref 15–41)
Albumin: 4.2 g/dL (ref 3.5–5.0)
Alkaline Phosphatase: 65 U/L (ref 38–126)
Anion gap: 7 (ref 5–15)
BUN: 16 mg/dL (ref 8–23)
CO2: 30 mmol/L (ref 22–32)
Calcium: 9.3 mg/dL (ref 8.9–10.3)
Chloride: 102 mmol/L (ref 98–111)
Creatinine: 0.97 mg/dL (ref 0.44–1.00)
GFR, Estimated: >60 mL/min (ref >60)
Glucose, Bld: 103 mg/dL — ABNORMAL HIGH (ref 70–99)
Potassium: 3.8 mmol/L (ref 3.5–5.1)
Sodium: 139 mmol/L (ref 135–145)
Total Bilirubin: 0.4 mg/dL (ref 0.3–1.2)
Total Protein: 8.2 g/dL — ABNORMAL HIGH (ref 6.5–8.1)

## 2022-08-11 MED ORDER — DEXTROSE 5 % IV SOLN: Freq: Once | INTRAVENOUS | Status: AC

## 2022-08-11 MED ORDER — PALONOSETRON HCL INJECTION 0.25 MG/5ML
0.2500 mg | Freq: Once | INTRAVENOUS | Status: AC
  Administered 2022-08-11: 0.25 mg via INTRAVENOUS
  Filled 2022-08-11: qty 5

## 2022-08-11 MED ORDER — HEPARIN SOD (PORK) LOCK FLUSH 100 UNIT/ML IV SOLN
500.0000 [IU] | Freq: Once | INTRAVENOUS | Status: AC | PRN
  Administered 2022-08-11: 500 [IU]

## 2022-08-11 MED ORDER — SODIUM CHLORIDE 0.9% FLUSH
10.0000 mL | INTRAVENOUS | Status: DC | PRN
  Administered 2022-08-11: 10 mL

## 2022-08-11 MED ORDER — SODIUM CHLORIDE 0.9 % IV SOLN
10.0000 mg | Freq: Once | INTRAVENOUS | Status: AC
  Administered 2022-08-11: 10 mg via INTRAVENOUS
  Filled 2022-08-11: qty 10

## 2022-08-11 MED ORDER — OXALIPLATIN CHEMO INJECTION 100 MG/20ML
115.0000 mg/m2 | Freq: Once | INTRAVENOUS | Status: AC
  Administered 2022-08-11: 195 mg via INTRAVENOUS
  Filled 2022-08-11: qty 39

## 2022-08-11 NOTE — Progress Notes (Signed)
Jade Alvarez   Telephone:(336) [phone] Fax:(336) [phone]   Clinic Follow up Note   Patient Care Team: Denita Lung, MD as PCP - General (Family Medicine) Truitt Merle, MD as Consulting Physician (Oncology) Royston Bake, RN as Oncology Nurse Navigator (Oncology)  Date of Service:  08/11/2022  CHIEF COMPLAINT: f/u of colon cancer  CURRENT THERAPY:  Adjuvant CAPEOX, q21d, starting 06/08/22             -dose: '1500mg'$  BID q12hr days 1-14  ASSESSMENT & PLAN:  Jade Alvarez is a 61 y.o. female with   1. 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 Colonoscopy on 03/03/22 showed partially obstructing tumor in proximal sigmoid colon, path confirmed adenocarcinoma. -s/p resection 04/28/22, path showed 3.3 cm invasive adenocarcinoma, moderately differentiated, extending into pericolonic tissue. Margins negative, 3/23 positive lymph nodes. -she took one week capecitabine starting 05/22/22, then went on planned vacation. She tolerated well with no noticeable side effects.  -baseline CEA 06/06/22 was WNL. -she began oxaliplatin and Xeloda on 06/08/22, plan for 4 cycles. She experienced a number of side effects, including cold sensitivity, mouth sensitivity with pain, mild constipation, headaches, and chills. She finally recovered during her off week. -she is tolerating better with reduced dose. She tells me today that she took the Xeloda for a week (through 10/12), then held it due to feeling bad. She notes she worked despite feeling badly. She then notes she restarted this past Monday, 10/23. I encouraged her to take for two weeks but to keep a calendar if she can't handle this. -lab reviewed, overall WNL. Will proceed as scheduled with reduced dose.     PLAN:  -proceed with C4 (final) oxali today with same reduced dose. -continue Xeloda at same dose until 11/5 (she started on 10/23), then she will start again on 11/12) and finish what she has  left  -lab and f/u on 11/21   No problem-specific Assessment & Plan notes found for this encounter.   SUMMARY OF ONCOLOGIC HISTORY: Oncology History Overview Note   Cancer Staging  Colon cancer [facility]) Staging form: Colon and Rectum, AJCC 8th Edition - Pathologic stage from 04/28/2022: Stage IIIB (pT3, pN1b, cM0) - Signed by Truitt Merle, MD on 05/18/2022    Colon cancer ([place])  06/23/2021 Miscellaneous   Cologuard - Positive   03/03/2022 Procedure   Colonoscopy, Dr. Candis Schatz  Impression: - Rule out malignancy, partially obstructing tumor in the proximal sigmoid colon. Biopsied. Tattooed. - Two 4 to 7 mm polyps in the transverse colon, removed with a cold snare. Resected and retrieved. - One 4 mm polyp at the hepatic flexure, removed with a cold snare. Resected and retrieved. - One 4 mm polyp at the splenic flexure, removed with a cold snare. Resected and retrieved. - One 3 mm polyp in the descending colon, removed with a cold snare. Resected and retrieved. - One 2 mm polyp in the rectum, removed with a cold snare. Resected and retrieved. - The examined portion of the ileum was normal. - The distal rectum and anal verge are normal on retroflexion view.   03/03/2022 Initial Biopsy   Diagnosis 1. Hepatic Flexure Biopsy, transverse, splenic flexure, descending, rectal, polyps (6) TUBULAR ADENOMAS. NEGATIVE FOR HIGH-GRADE DYSPLASIA. 2. Sigmoid Colon Biopsy INVASIVE ADENOCARCINOMA, MODERATELY DIFFERENTIATED (GRADE 2).   03/17/2022 Imaging   EXAM: CT CHEST, ABDOMEN, AND PELVIS WITH CONTRAST  IMPRESSION: 1. Short segment wall thickening of the descending colon which possibly reflects patient's  known primary colonic neoplasm. 2. Prominent mesenteric lymph nodes adjacent to the area of descending colonic wall thickening measure up to 8 mm and likely reflect local nodal involvement. 3. No evidence of distant metastatic disease in the chest, abdomen or pelvis.   04/28/2022 Initial  Diagnosis   Colon cancer ([place])   04/28/2022 Cancer Staging   Staging form: Colon and Rectum, AJCC 8th Edition - Pathologic stage from 04/28/2022: Stage IIIB (pT3, pN1b, cM0) - Signed by Truitt Merle, MD on 05/18/2022 Stage prefix: Initial diagnosis Histologic grading system: 4 grade system Histologic grade (G): G2 Residual tumor (R): R0 - None   04/28/2022 Definitive Surgery   FINAL MICROSCOPIC DIAGNOSIS:   A. COLON, LEFT, RESECTION:  - Invasive colonic adenocarcinoma, moderately differentiated, 3.3 cm.  - Carcinoma extends into pericolonic connective tissue.  - All surgical margins negative for carcinoma.  - Metastatic carcinoma in three of twenty-three lymph nodes (3/23) with extranodal extension.  - One satellite tumor nodule.  - See oncology table.    06/08/2022 - 06/08/2022 Chemotherapy   Patient is on Treatment Plan : COLORECTAL Xelox (Capeox) q21d     06/08/2022 -  Chemotherapy   Patient is on Treatment Plan : RECTAL Xelox (Capeox) (130/850) q21d x 6 cycles        INTERVAL HISTORY:  Jade Alvarez is here for a follow up of colon cancer. She was last seen by me on 07/21/22. She presents to the clinic accompanied by her husband. She reports she did well with last cycle, but she tells me she experienced a day of mouth bleeding. She denies pain or any other bleeding. She explains she did a peroxide rinse, and the bleeding resolved.   All other systems were reviewed with the patient and are negative.  MEDICAL HISTORY:  Past Medical History:  Diagnosis Date   Anemia    PAST HX - NO ISSUES SINCE PARTIAL HYSTERECTOMY   Blood transfusion without reported diagnosis    PRIOR TO HYSTERECTOMY   Colon cancer ([place]) 04/28/2022   Hypertension    Positive colorectal cancer screening using Cologuard test 06/24/2021    SURGICAL HISTORY: Past Surgical History:  Procedure Laterality Date   ABDOMINAL HYSTERECTOMY     PARTIAL   CESAREAN SECTION     x2   COLONOSCOPY  03/03/2022    FLEXIBLE SIGMOIDOSCOPY N/A 04/28/2022   Procedure: FLEXIBLE SIGMOIDOSCOPY;  Surgeon: Ileana Roup, MD;  Location: [facility];  Service: General;  Laterality: N/A;   WISDOM TOOTH EXTRACTION      I have reviewed the social history and family history with the patient and they are unchanged from previous note.  ALLERGIES:  is allergic to penicillins.  MEDICATIONS:  Current Outpatient Medications  Medication Sig Dispense Refill   EDARBYCLOR 40-25 MG TABS TAKE 1 TABLET BY MOUTH ONCE DAILY (Patient taking differently: Take 1 tablet by mouth daily.) 90 tablet 0   ondansetron (ZOFRAN) 8 MG tablet Take 1 tablet (8 mg total) by mouth every 8 (eight) hours as needed for nausea or vomiting. Start on the third day after chemotherapy. 30 tablet 1   prochlorperazine (COMPAZINE) 10 MG tablet Take 1 tablet (10 mg total) by mouth every 6 (six) hours as needed for nausea or vomiting. 30 tablet 1   XELODA 500 MG tablet TAKE 3 TABLETS BY MOUTH EVERY 12 HOURS FOR 14 DAYS, THEN OFF FOR 7 DAYS. TAKE WITHIN 30 MINUTES AFTER MEALS. 84 tablet 0   No current facility-administered medications for this visit.  Facility-Administered Medications Ordered in Other Visits  Medication Dose Route Frequency Provider Last Rate Last Admin   heparin lock flush 100 unit/mL  500 Units Intracatheter Once PRN Truitt Merle, MD       oxaliplatin (ELOXATIN) 195 mg in dextrose 5 % 500 mL chemo infusion  115 mg/m2 (Order-Specific) Intravenous Once Truitt Merle, MD 270 mL/hr at 08/11/22 1125 195 mg at 08/11/22 1125   sodium chloride flush (NS) 0.9 % injection 10 mL  10 mL Intracatheter PRN Truitt Merle, MD        PHYSICAL EXAMINATION: ECOG PERFORMANCE STATUS: 1 - Symptomatic but completely ambulatory  Vitals:   08/11/22 0851  BP: (!) 146/78  Pulse: 90  Resp: 18  Temp: 98.5 F (36.9 C)  SpO2: 99%   Wt Readings from Last 3 Encounters:  08/11/22 162 lb 4.8 oz (73.6 kg)  07/21/22 164 lb (74.4 kg)  06/30/22 161 lb (73 kg)      GENERAL:alert, no distress and comfortable SKIN: skin color normal, no rashes or significant lesions EYES: normal, Conjunctiva are pink and non-injected, sclera clear  NEURO: alert & oriented x 3 with fluent speech  LABORATORY DATA:  I have reviewed the data as listed    Latest Ref Rng & Units 08/11/2022    8:31 AM 07/21/2022   11:49 AM 06/30/2022   12:10 PM  CBC  WBC 4.0 - 10.5 K/uL 6.6  6.3  7.2   Hemoglobin 12.0 - 15.0 g/dL 12.6  12.0  12.9   Hematocrit 36.0 - 46.0 % 38.1  36.0  39.0   Platelets 150 - 400 K/uL 367  308  308         Latest Ref Rng & Units 08/11/2022    8:31 AM 07/21/2022   11:49 AM 06/30/2022   12:10 PM  CMP  Glucose 70 - 99 mg/dL 103  86  89   BUN 8 - 23 mg/dL '16  16  20   '$ Creatinine 0.44 - 1.00 mg/dL 0.97  0.93  0.84   Sodium 135 - 145 mmol/L 139  136  137   Potassium 3.5 - 5.1 mmol/L 3.8  3.7  3.6   Chloride 98 - 111 mmol/L 102  100  100   CO2 22 - 32 mmol/L '30  30  27   '$ Calcium 8.9 - 10.3 mg/dL 9.3  8.9  9.2   Total Protein 6.5 - 8.1 g/dL 8.2  8.0  7.9   Total Bilirubin 0.3 - 1.2 mg/dL 0.4  0.3  0.5   Alkaline Phos 38 - 126 U/L 65  70  66   AST 15 - 41 U/L 25  30  34   ALT 0 - 44 U/L 23  33  55       RADIOGRAPHIC STUDIES: I have personally reviewed the radiological images as listed and agreed with the findings in the report. No results found.    No orders of the defined types were placed in this encounter.  All questions were answered. The patient knows to call the clinic with any problems, questions or concerns. No barriers to learning was detected. The total time spent in the appointment was 30 minutes.     Truitt Merle, MD 08/11/2022   I, Wilburn Mylar, am acting as scribe for Truitt Merle, MD.   I have reviewed the above documentation for accuracy and completeness, and I agree with the above.

## 2022-08-11 NOTE — Patient Instructions (Signed)
[facility]  Discharge Instructions: Thank you for choosing [facility] to provide your oncology and hematology care.   If you have a lab appointment with the [facility], please go directly to the [facility] and check in at the registration area.   Wear comfortable clothing and clothing appropriate for easy access to any Portacath or PICC line.   We strive to give you quality time with your provider. You may need to reschedule your appointment if you arrive late (15 or more minutes).  Arriving late affects you and other patients whose appointments are after yours.  Also, if you miss three or more appointments without notifying the office, you may be dismissed from the clinic at the provider's discretion.      For prescription refill requests, have your pharmacy contact our office and allow 72 hours for refills to be completed.    Today you received the following chemotherapy and/or immunotherapy agents oxaliplatin      To help prevent nausea and vomiting after your treatment, we encourage you to take your nausea medication as directed.  BELOW ARE SYMPTOMS THAT SHOULD BE REPORTED IMMEDIATELY: *FEVER GREATER THAN 100.4 F (38 C) OR HIGHER *CHILLS OR SWEATING *NAUSEA AND VOMITING THAT IS NOT CONTROLLED WITH YOUR NAUSEA MEDICATION *UNUSUAL SHORTNESS OF BREATH *UNUSUAL BRUISING OR BLEEDING *URINARY PROBLEMS (pain or burning when urinating, or frequent urination) *BOWEL PROBLEMS (unusual diarrhea, constipation, pain near the anus) TENDERNESS IN MOUTH AND THROAT WITH OR WITHOUT PRESENCE OF ULCERS (sore throat, sores in mouth, or a toothache) UNUSUAL RASH, SWELLING OR PAIN  UNUSUAL VAGINAL DISCHARGE OR ITCHING   Items with * indicate a potential emergency and should be followed up as soon as possible or go to the Emergency Department if any problems should occur.  Please show the CHEMOTHERAPY ALERT CARD or IMMUNOTHERAPY ALERT CARD at check-in to  the Emergency Department and triage nurse.  Should you have questions after your visit or need to cancel or reschedule your appointment, please contact [facility]  Dept: [phone]  and follow the prompts.  Office hours are 8:00 a.m. to 4:30 p.m. Monday - Friday. Please note that voicemails left after 4:00 p.m. may not be returned until the following business day.  We are closed weekends and major holidays. You have access to a nurse at all times for urgent questions. Please call the main number to the clinic Dept: [phone] and follow the prompts.   For any non-urgent questions, you may also contact your provider using MyChart. We now offer e-Visits for anyone 73 and older to request care online for non-urgent symptoms. For details visit [email or web address].   Also download the MyChart app! Go to the app store, search "MyChart", open the app, select [facility], and log in with your MyChart username and password.  Masks are optional in the cancer centers. If you would like for your care team to wear a mask while they are taking care of you, please let them know. You may have one support person who is at least 63 years old accompany you for your appointments.

## 2022-08-31 ENCOUNTER — Other Ambulatory Visit: Payer: Self-pay | Admitting: Hematology

## 2022-08-31 DIAGNOSIS — C187 Malignant neoplasm of sigmoid colon: Secondary | ICD-10-CM

## 2022-09-05 ENCOUNTER — Ambulatory Visit: Payer: No Typology Code available for payment source | Admitting: Hematology

## 2022-09-05 ENCOUNTER — Other Ambulatory Visit: Payer: Self-pay | Admitting: Hematology

## 2022-09-05 ENCOUNTER — Other Ambulatory Visit: Payer: No Typology Code available for payment source

## 2022-09-05 DIAGNOSIS — C187 Malignant neoplasm of sigmoid colon: Secondary | ICD-10-CM

## 2022-09-14 ENCOUNTER — Other Ambulatory Visit: Payer: No Typology Code available for payment source

## 2022-09-14 ENCOUNTER — Ambulatory Visit: Payer: No Typology Code available for payment source | Admitting: Hematology

## 2022-09-21 NOTE — Progress Notes (Signed)
Jade Alvarez   Telephone:(336) [phone] Fax:(336) [phone]   Clinic Follow up Note   Patient Care Team: Denita Lung, MD as PCP - General (Family Medicine) Truitt Merle, MD as Consulting Physician (Oncology) Royston Bake, RN as Oncology Nurse Navigator (Oncology)  Date of Service:  09/22/2022  CHIEF COMPLAINT: f/u of colon cancer   CURRENT THERAPY:  Adjuvant CAPEOX, q21d, starting 06/08/22             -dose: '1500mg'$  BID q12hr days 1-14    ASSESSMENT:  Jade Alvarez is a 61 y.o. female with   Colon cancer ([place]) stage IIIB, p(T3, N1b) cM0, MSS -positive Cologuard 06/23/21. Colonoscopy on 03/03/22 showed partially obstructing tumor in proximal sigmoid colon, path confirmed adenocarcinoma. -s/p resection 04/28/22 -s/p adjuvant chemo CAPOX 05/22/2022-08/24/2022, overall did not tolerate well and required dose reduction  -on cancer surveillance now. She has recovered well from chemotherapy, no residual side effects. -She is clinically doing well, exam unremarkable, lab reviewed, no clinical concern for recurrence.    PLAN: -Doing well, continue cancer surveillance. -lab ,f/u , CT Scan 3 mths      SUMMARY OF ONCOLOGIC HISTORY: Oncology History Overview Note   Cancer Staging  Colon cancer [facility]) Staging form: Colon and Rectum, AJCC 8th Edition - Pathologic stage from 04/28/2022: Stage IIIB (pT3, pN1b, cM0) - Signed by Truitt Merle, MD on 05/18/2022    Colon cancer ([place])  06/23/2021 Miscellaneous   Cologuard - Positive   03/03/2022 Procedure   Colonoscopy, Dr. Candis Schatz  Impression: - Rule out malignancy, partially obstructing tumor in the proximal sigmoid colon. Biopsied. Tattooed. - Two 4 to 7 mm polyps in the transverse colon, removed with a cold snare. Resected and retrieved. - One 4 mm polyp at the hepatic flexure, removed with a cold snare. Resected and retrieved. - One 4 mm polyp at the splenic flexure, removed with a cold snare. Resected and retrieved. -  One 3 mm polyp in the descending colon, removed with a cold snare. Resected and retrieved. - One 2 mm polyp in the rectum, removed with a cold snare. Resected and retrieved. - The examined portion of the ileum was normal. - The distal rectum and anal verge are normal on retroflexion view.   03/03/2022 Initial Biopsy   Diagnosis 1. Hepatic Flexure Biopsy, transverse, splenic flexure, descending, rectal, polyps (6) TUBULAR ADENOMAS. NEGATIVE FOR HIGH-GRADE DYSPLASIA. 2. Sigmoid Colon Biopsy INVASIVE ADENOCARCINOMA, MODERATELY DIFFERENTIATED (GRADE 2).   03/17/2022 Imaging   EXAM: CT CHEST, ABDOMEN, AND PELVIS WITH CONTRAST  IMPRESSION: 1. Short segment wall thickening of the descending colon which possibly reflects patient's known primary colonic neoplasm. 2. Prominent mesenteric lymph nodes adjacent to the area of descending colonic wall thickening measure up to 8 mm and likely reflect local nodal involvement. 3. No evidence of distant metastatic disease in the chest, abdomen or pelvis.   04/28/2022 Initial Diagnosis   Colon cancer ([place])   04/28/2022 Cancer Staging   Staging form: Colon and Rectum, AJCC 8th Edition - Pathologic stage from 04/28/2022: Stage IIIB (pT3, pN1b, cM0) - Signed by Truitt Merle, MD on 05/18/2022 Stage prefix: Initial diagnosis Histologic grading system: 4 grade system Histologic grade (G): G2 Residual tumor (R): R0 - None   04/28/2022 Definitive Surgery   FINAL MICROSCOPIC DIAGNOSIS:   A. COLON, LEFT, RESECTION:  - Invasive colonic adenocarcinoma, moderately differentiated, 3.3 cm.  - Carcinoma extends into pericolonic connective tissue.  - All surgical margins negative for carcinoma.  - Metastatic carcinoma  in three of twenty-three lymph nodes (3/23) with extranodal extension.  - One satellite tumor nodule.  - See oncology table.    06/08/2022 - 06/08/2022 Chemotherapy   Patient is on Treatment Plan : COLORECTAL Xelox (Capeox) q21d     06/08/2022 -   Chemotherapy   Patient is on Treatment Plan : RECTAL Xelox (Capeox) (130/850) q21d x 6 cycles        INTERVAL HISTORY:  Jade Alvarez is here for a follow up of colon cancer  She was last seen by me on 08/11/2022 She presents to the clinic accompanied by husband. The last treatment was extreme she stated with Nausea. Her energy is slowly coming back. Pt denies stomach issues and is eating very well.     All other systems were reviewed with the patient and are negative.  MEDICAL HISTORY:  Past Medical History:  Diagnosis Date   Anemia    PAST HX - NO ISSUES SINCE PARTIAL HYSTERECTOMY   Blood transfusion without reported diagnosis    PRIOR TO HYSTERECTOMY   Colon cancer ([place]) 04/28/2022   Hypertension    Positive colorectal cancer screening using Cologuard test 06/24/2021    SURGICAL HISTORY: Past Surgical History:  Procedure Laterality Date   ABDOMINAL HYSTERECTOMY     PARTIAL   CESAREAN SECTION     x2   COLONOSCOPY  03/03/2022   FLEXIBLE SIGMOIDOSCOPY N/A 04/28/2022   Procedure: FLEXIBLE SIGMOIDOSCOPY;  Surgeon: Ileana Roup, MD;  Location: [facility];  Service: General;  Laterality: N/A;   WISDOM TOOTH EXTRACTION      I have reviewed the social history and family history with the patient and they are unchanged from previous note.  ALLERGIES:  is allergic to penicillins.  MEDICATIONS:  Current Outpatient Medications  Medication Sig Dispense Refill   capecitabine (XELODA) 500 MG tablet TAKE 3 TABLETS BY MOUTH EVERY 12 HOURS FOR 14 DAYS, THEN OFF FOR 7 DAYS. TAKE WITHIN 30 MINUTES AFTER MEALS. 84 tablet 0   EDARBYCLOR 40-25 MG TABS TAKE 1 TABLET BY MOUTH ONCE DAILY (Patient taking differently: Take 1 tablet by mouth daily.) 90 tablet 0   ondansetron (ZOFRAN) 8 MG tablet Take 1 tablet (8 mg total) by mouth every 8 (eight) hours as needed for nausea or vomiting. Start on the third day after chemotherapy. 30 tablet 1   prochlorperazine (COMPAZINE) 10 MG tablet Take 1  tablet (10 mg total) by mouth every 6 (six) hours as needed for nausea or vomiting. 30 tablet 1   No current facility-administered medications for this visit.    PHYSICAL EXAMINATION: ECOG PERFORMANCE STATUS: 0 - Asymptomatic  Vitals:   09/22/22 1353  BP: 139/84  Pulse: 80  Resp: 16  Temp: 98.3 F (36.8 C)  SpO2: 100%   Wt Readings from Last 3 Encounters:  09/22/22 161 lb 12.8 oz (73.4 kg)  08/11/22 162 lb 4.8 oz (73.6 kg)  07/21/22 164 lb (74.4 kg)    GENERAL:alert, no distress and comfortable SKIN: skin color, texture, turgor are normal, no rashes or significant lesions EYES: normal, Conjunctiva are pink and non-injected, sclera clear NECK: supple, thyroid normal size, non-tender, without nodularity LYMPH:  no palpable lymphadenopathy in the cervical, axillary LUNGS: clear to auscultation and percussion with normal breathing effort HEART: regular rate & rhythm and no murmurs and no lower extremity edema ABDOMEN:abdomen soft, non-tender and normal bowel sounds Musculoskeletal:no cyanosis of digits and no clubbing  NEURO: alert & oriented x 3 with fluent speech, no focal motor/sensory  deficits   LABORATORY DATA:  I have reviewed the data as listed    Latest Ref Rng & Units 09/22/2022    1:35 PM 08/11/2022    8:31 AM 07/21/2022   11:49 AM  CBC  WBC 4.0 - 10.5 K/uL 7.1  6.6  6.3   Hemoglobin 12.0 - 15.0 g/dL 12.8  12.6  12.0   Hematocrit 36.0 - 46.0 % 38.4  38.1  36.0   Platelets 150 - 400 K/uL 328  367  308         Latest Ref Rng & Units 09/22/2022    1:35 PM 08/11/2022    8:31 AM 07/21/2022   11:49 AM  CMP  Glucose 70 - 99 mg/dL 95  103  86   BUN 8 - 23 mg/dL '17  16  16   '$ Creatinine 0.44 - 1.00 mg/dL 0.96  0.97  0.93   Sodium 135 - 145 mmol/L 138  139  136   Potassium 3.5 - 5.1 mmol/L 3.9  3.8  3.7   Chloride 98 - 111 mmol/L 100  102  100   CO2 22 - 32 mmol/L 32  30  30   Calcium 8.9 - 10.3 mg/dL 9.8  9.3  8.9   Total Protein 6.5 - 8.1 g/dL 7.8  8.2  8.0    Total Bilirubin 0.3 - 1.2 mg/dL 0.4  0.4  0.3   Alkaline Phos 38 - 126 U/L 72  65  70   AST 15 - 41 U/L '22  25  30   '$ ALT 0 - 44 U/L 16  23  33       RADIOGRAPHIC STUDIES: I have personally reviewed the radiological images as listed and agreed with the findings in the report. No results found.    Orders Placed This Encounter  Procedures   CT CHEST ABDOMEN PELVIS W CONTRAST    Standing Status:   Future    Standing Expiration Date:   09/23/2023    Order Specific Question:   Preferred imaging location?    Answer:   [facility]    Order Specific Question:   Release to patient    Answer:   Immediate    Order Specific Question:   Is Oral Contrast requested for this exam?    Answer:   Yes, Per Radiology protocol   MM Digital Screening    Standing Status:   Future    Standing Expiration Date:   09/22/2023    Order Specific Question:   Reason for Exam (SYMPTOM  OR DIAGNOSIS REQUIRED)    Answer:   screening    Order Specific Question:   Preferred imaging location?    Answer:   [facility]   All questions were answered. The patient knows to call the clinic with any problems, questions or concerns. No barriers to learning was detected. The total time spent in the appointment was 25 minutes.     Truitt Merle, MD 09/22/2022   Felicity Coyer, CMA, am acting as scribe for Truitt Merle, MD.   I have reviewed the above documentation for accuracy and completeness, and I agree with the above.

## 2022-09-22 ENCOUNTER — Inpatient Hospital Stay (HOSPITAL_BASED_OUTPATIENT_CLINIC_OR_DEPARTMENT_OTHER): Payer: 59 | Admitting: Hematology

## 2022-09-22 ENCOUNTER — Other Ambulatory Visit: Payer: Self-pay

## 2022-09-22 ENCOUNTER — Encounter: Payer: Self-pay | Admitting: Hematology

## 2022-09-22 ENCOUNTER — Inpatient Hospital Stay: Payer: 59 | Attending: Hematology

## 2022-09-22 VITALS — BP 139/84 | HR 80 | Temp 98.3°F | Resp 16 | Ht 62.0 in | Wt 161.8 lb

## 2022-09-22 DIAGNOSIS — C187 Malignant neoplasm of sigmoid colon: Secondary | ICD-10-CM | POA: Insufficient documentation

## 2022-09-22 DIAGNOSIS — Z9221 Personal history of antineoplastic chemotherapy: Secondary | ICD-10-CM | POA: Diagnosis not present

## 2022-09-22 DIAGNOSIS — Z1231 Encounter for screening mammogram for malignant neoplasm of breast: Secondary | ICD-10-CM

## 2022-09-22 LAB — CBC WITH DIFFERENTIAL/PLATELET
Abs Immature Granulocytes: 0.01 10*3/uL (ref 0.00–0.07)
Basophils Absolute: 0 10*3/uL (ref 0.0–0.1)
Basophils Relative: 0 %
Eosinophils Absolute: 0.1 10*3/uL (ref 0.0–0.5)
Eosinophils Relative: 2 %
HCT: 38.4 % (ref 36.0–46.0)
Hemoglobin: 12.8 g/dL (ref 12.0–15.0)
Immature Granulocytes: 0 %
Lymphocytes Relative: 36 %
Lymphs Abs: 2.5 10*3/uL (ref 0.7–4.0)
MCH: 32.3 pg (ref 26.0–34.0)
MCHC: 33.3 g/dL (ref 30.0–36.0)
MCV: 97 fL (ref 80.0–100.0)
Monocytes Absolute: 1.1 10*3/uL — ABNORMAL HIGH (ref 0.1–1.0)
Monocytes Relative: 15 %
Neutro Abs: 3.3 10*3/uL (ref 1.7–7.7)
Neutrophils Relative %: 47 %
Platelets: 328 10*3/uL (ref 150–400)
RBC: 3.96 MIL/uL (ref 3.87–5.11)
RDW: 18 % — ABNORMAL HIGH (ref 11.5–15.5)
WBC: 7.1 10*3/uL (ref 4.0–10.5)
nRBC: 0 % (ref 0.0–0.2)

## 2022-09-22 LAB — COMPREHENSIVE METABOLIC PANEL
ALT: 16 U/L (ref 0–44)
AST: 22 U/L (ref 15–41)
Albumin: 4.2 g/dL (ref 3.5–5.0)
Alkaline Phosphatase: 72 U/L (ref 38–126)
Anion gap: 6 (ref 5–15)
BUN: 17 mg/dL (ref 8–23)
CO2: 32 mmol/L (ref 22–32)
Calcium: 9.8 mg/dL (ref 8.9–10.3)
Chloride: 100 mmol/L (ref 98–111)
Creatinine, Ser: 0.96 mg/dL (ref 0.44–1.00)
GFR, Estimated: >60 mL/min (ref >60)
Glucose, Bld: 95 mg/dL (ref 70–99)
Potassium: 3.9 mmol/L (ref 3.5–5.1)
Sodium: 138 mmol/L (ref 135–145)
Total Bilirubin: 0.4 mg/dL (ref 0.3–1.2)
Total Protein: 7.8 g/dL (ref 6.5–8.1)

## 2022-09-22 LAB — CEA (IN HOUSE-CHCC): CEA (CHCC-In House): 1 ng/mL (ref 0.00–5.00)

## 2022-09-22 NOTE — Assessment & Plan Note (Signed)
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 Colonoscopy on 03/03/22 showed partially obstructing tumor in proximal sigmoid colon, path confirmed adenocarcinoma. -s/p resection 04/28/22 -s/p adjuvant chemo CAPOX 05/22/2022-08/24/2022, overall did not tolerate well and required dose reduction  -on cancer surveillance now.

## 2022-09-24 ENCOUNTER — Other Ambulatory Visit: Payer: Self-pay

## 2022-09-28 ENCOUNTER — Other Ambulatory Visit: Payer: Self-pay

## 2022-10-29 IMAGING — CT CT CHEST-ABD-PELV W/ CM
3 of 5 series · 14 of 36 positions shown, 16 images · IV contrast (APPLIED)
Comparison: None Available.

CLINICAL DATA: Colon cancer, staging.  * Tracking Code: BO *

EXAM:
CT CHEST, ABDOMEN, AND PELVIS WITH CONTRAST
TECHNIQUE: Multidetector CT imaging of the chest, abdomen and pelvis was
performed following the standard protocol during bolus
administration of intravenous contrast.

[Series 2: cap with · axial · 0.77mm/px · z∈[-600,-120]mm · 9 of 122 slices shown, 11 images]
[im 13/122  mediastinal]
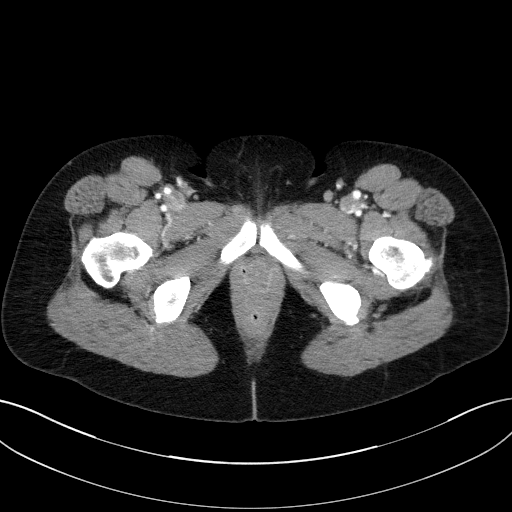
[im 13/122  bone]
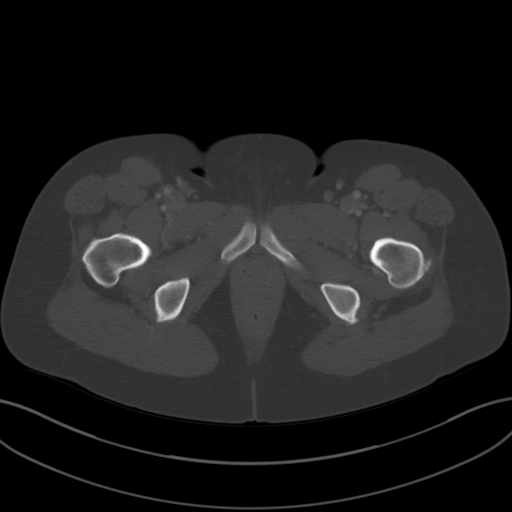
[im 25/122  mediastinal]
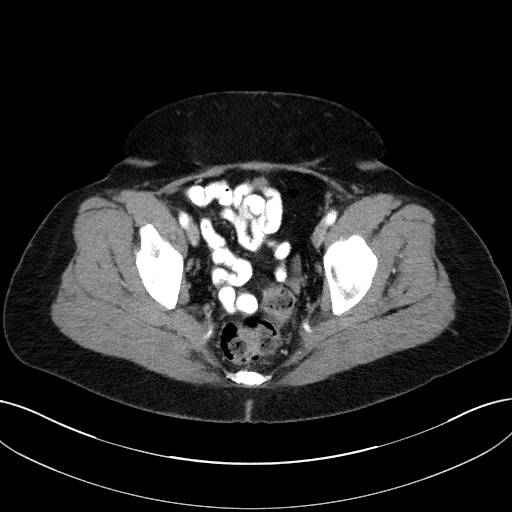
[im 37/122  mediastinal]
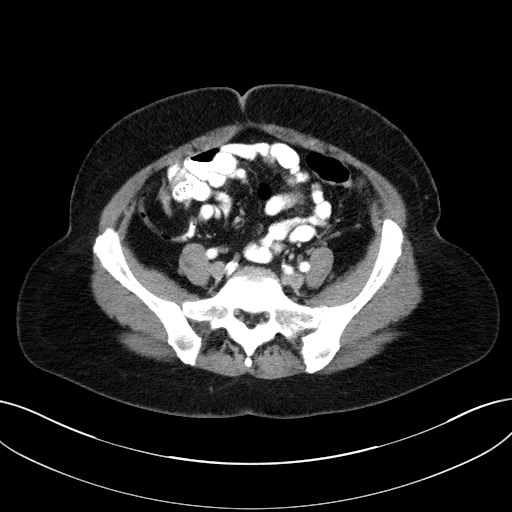
[im 49/122  mediastinal]
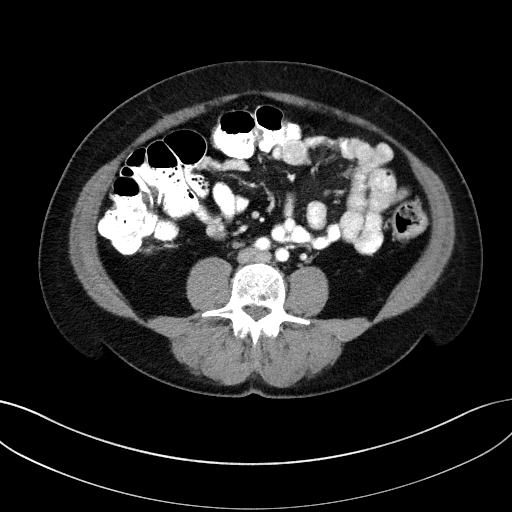
[im 61/122  mediastinal]
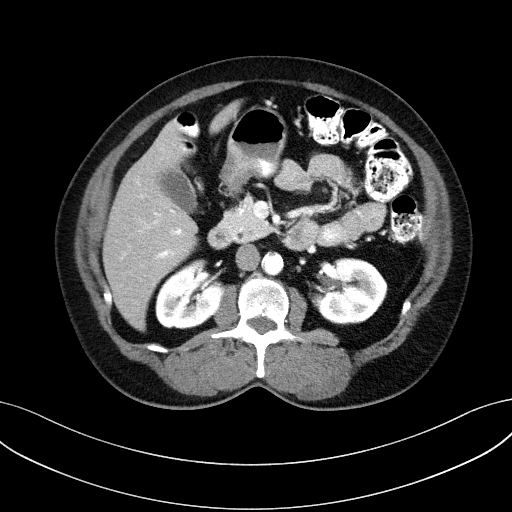
[im 73/122  mediastinal]
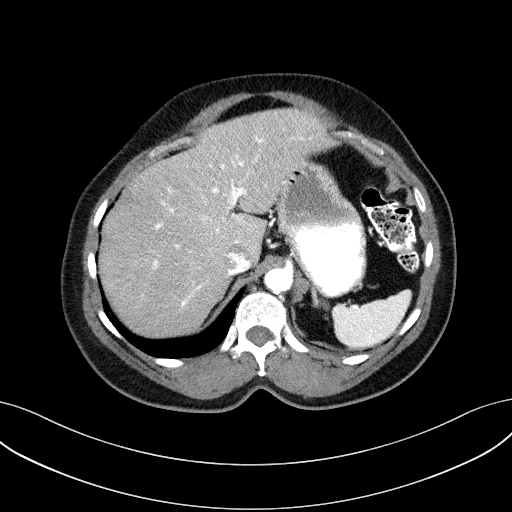
[im 85/122  mediastinal]
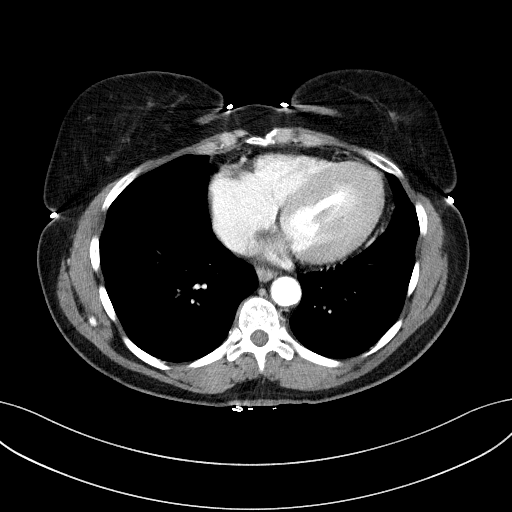
[im 97/122  mediastinal]
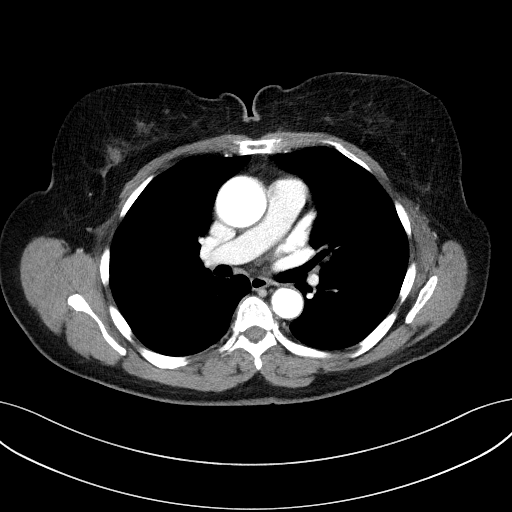
[im 109/122  mediastinal]
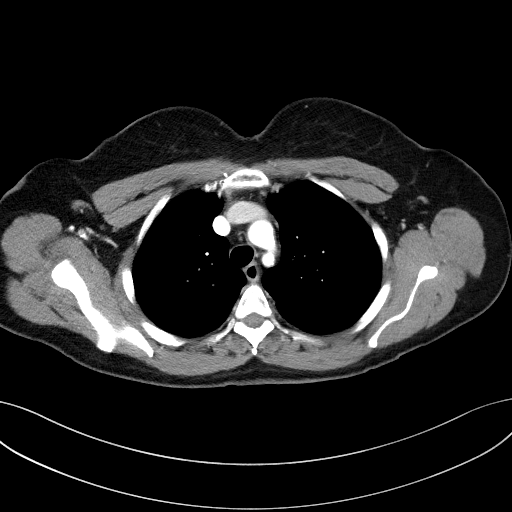
[im 109/122  bone]
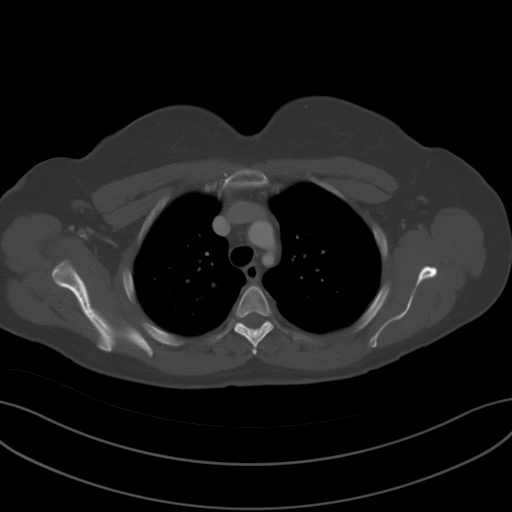

[Series 4: coronals · coronal · 0.75mm/px · 3 of 143 slices shown]
[im 29/143  mediastinal]
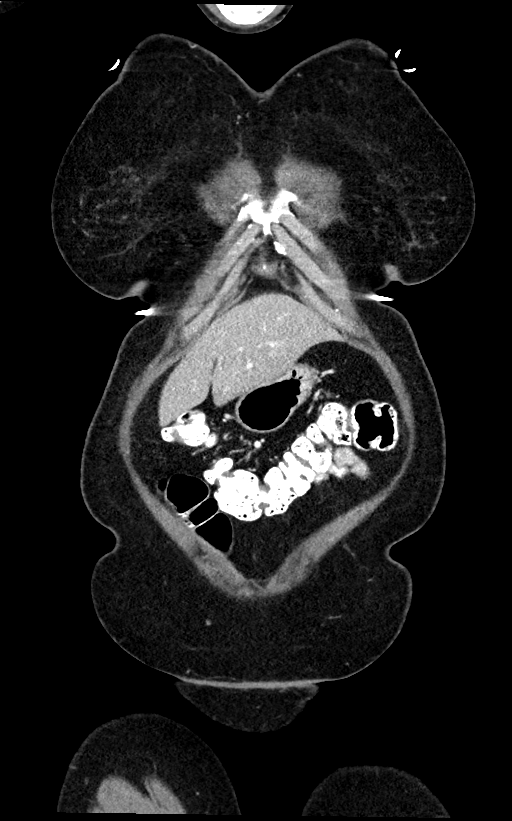
[im 57/143  mediastinal]
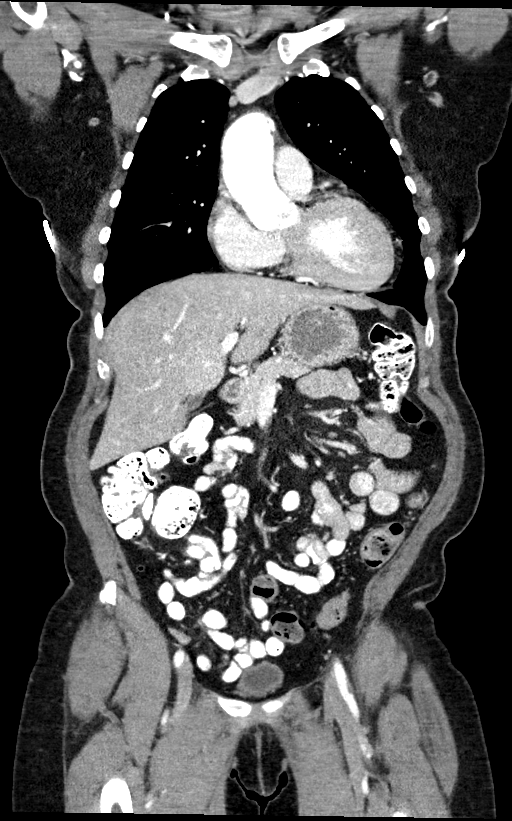
[im 86/143  mediastinal]
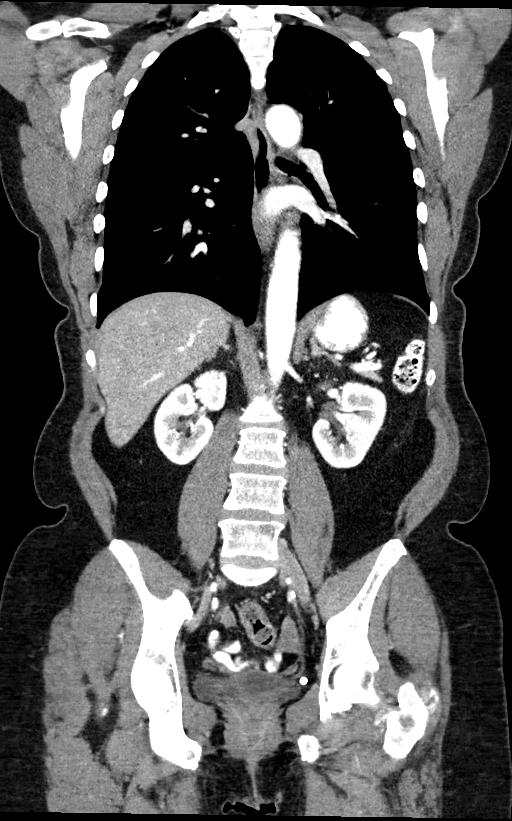

[Series 6: lung · axial · 0.77mm/px · z∈[-323,-279]mm · 2 of 146 slices shown]
[im 12/146  bone]
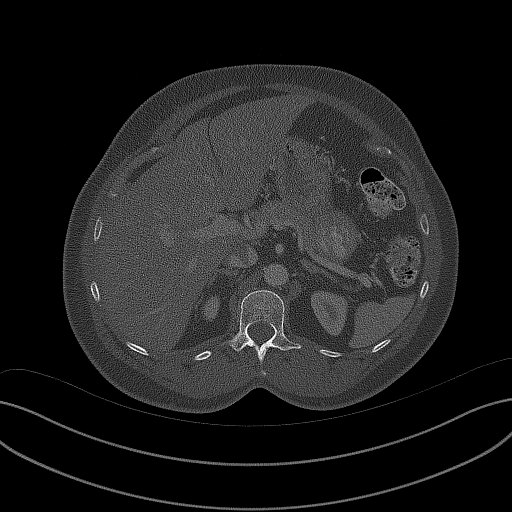
[im 34/146  bone]
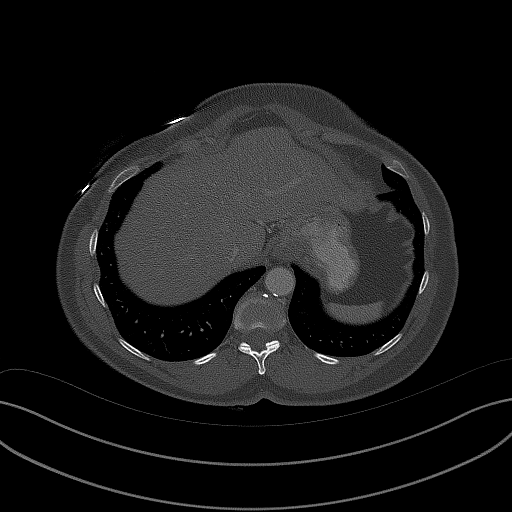

[14 of 36 positions shown; findings below may reference images not displayed]

RADIATION DOSE REDUCTION: This exam was performed according to the
departmental dose-optimization program which includes automated
exposure control, adjustment of the mA and/or kV according to
patient size and/or use of iterative reconstruction technique.

CONTRAST:  100mL OMNIPAQUE IOHEXOL 300 MG/ML  SOLN
FINDINGS: CT CHEST FINDINGS

Cardiovascular: Aortic atherosclerosis. Aneurysmal dilation of the
ascending thoracic aorta measuring 4 cm. No central pulmonary
embolus on this nondedicated study. Normal size heart. No
significant pericardial effusion/thickening.

Mediastinum/Nodes: No supraclavicular adenopathy. No suspicious
thyroid nodule. No pathologically enlarged mediastinal, hilar or
axillary lymph nodes.

Lungs/Pleura: Minimal biapical pleuroparenchymal scarring. No
suspicious pulmonary nodules or masses. No pleural effusion. No
pneumothorax.

Musculoskeletal: No chest wall mass or suspicious bone lesions
identified.

CT ABDOMEN PELVIS FINDINGS

Hepatobiliary: No suspicious hepatic lesion. Gallbladder is
unremarkable. No biliary ductal dilation.

Pancreas: No pancreatic ductal dilation or evidence of acute
inflammation.

Spleen: Splenomegaly or focal splenic lesion.

Adrenals/Urinary Tract: Bilateral adrenal glands appear normal. No
hydronephrosis. Kidneys demonstrate symmetric enhancement and
excretion of contrast material. Urinary bladder is unremarkable for
degree of distension.

Stomach/Bowel: Radiopaque enteric contrast material traverses the
descending colon. Short segment wall thickening of the descending
colon on image 77/2 and 65/4. There is some prominent mesenteric
lymph nodes adjacent to this area of thickening measuring up to 8 mm
in short axis on image 70/2.

No pathologic dilation of large or small bowel. No evidence of acute
bowel inflammation.

Vascular/Lymphatic: Aortic and branch vessel atherosclerosis without
abdominal aortic aneurysm. No pathologically enlarged abdominal or
pelvic lymph nodes.

Reproductive: Status post hysterectomy. No adnexal masses.

Other: No significant abdominopelvic free fluid.

Musculoskeletal: No aggressive lytic or blastic lesion of bone.
Multilevel degenerative changes spine. Degenerative changes
bilateral SI joints with partial bony ankylosis.
IMPRESSION: 1. Short segment wall thickening of the descending colon which
possibly reflects patient's known primary colonic neoplasm.
2. Prominent mesenteric lymph nodes adjacent to the area of
descending colonic wall thickening measure up to 8 mm and likely
reflect local nodal involvement.
3. No evidence of distant metastatic disease in the chest, abdomen
or pelvis.

## 2022-11-03 ENCOUNTER — Telehealth: Payer: Self-pay | Admitting: Family Medicine

## 2022-11-03 ENCOUNTER — Encounter: Payer: 59 | Admitting: Family Medicine

## 2022-11-03 NOTE — Telephone Encounter (Signed)
Jade Alvarez called and states edarbyclor is on back order and they aren't sure when they will be able to get more out but also she has been paying out of pocket and isn't sure she can pay for it anymore. She is wondering if there is an alternative she can take for this and she prefers  [name] - [address]

## 2022-11-06 ENCOUNTER — Ambulatory Visit (INDEPENDENT_AMBULATORY_CARE_PROVIDER_SITE_OTHER): Payer: 59 | Admitting: Family Medicine

## 2022-11-06 ENCOUNTER — Encounter: Payer: Self-pay | Admitting: Family Medicine

## 2022-11-06 VITALS — BP 168/102 | HR 82 | Temp 97.9°F | Ht 62.0 in | Wt 166.0 lb

## 2022-11-06 DIAGNOSIS — Z90710 Acquired absence of both cervix and uterus: Secondary | ICD-10-CM

## 2022-11-06 DIAGNOSIS — E1169 Type 2 diabetes mellitus with other specified complication: Secondary | ICD-10-CM

## 2022-11-06 DIAGNOSIS — E1159 Type 2 diabetes mellitus with other circulatory complications: Secondary | ICD-10-CM

## 2022-11-06 DIAGNOSIS — I152 Hypertension secondary to endocrine disorders: Secondary | ICD-10-CM

## 2022-11-06 DIAGNOSIS — E785 Hyperlipidemia, unspecified: Secondary | ICD-10-CM | POA: Diagnosis not present

## 2022-11-06 DIAGNOSIS — R7303 Prediabetes: Secondary | ICD-10-CM

## 2022-11-06 DIAGNOSIS — Z0000 Encounter for general adult medical examination without abnormal findings: Secondary | ICD-10-CM

## 2022-11-06 DIAGNOSIS — C187 Malignant neoplasm of sigmoid colon: Secondary | ICD-10-CM

## 2022-11-06 DIAGNOSIS — Z23 Encounter for immunization: Secondary | ICD-10-CM | POA: Diagnosis not present

## 2022-11-06 DIAGNOSIS — E119 Type 2 diabetes mellitus without complications: Secondary | ICD-10-CM | POA: Insufficient documentation

## 2022-11-06 DIAGNOSIS — I10 Essential (primary) hypertension: Secondary | ICD-10-CM

## 2022-11-06 LAB — POCT GLYCOSYLATED HEMOGLOBIN (HGB A1C): Hemoglobin A1C: 6.9 % — AB (ref 4.0–5.6)

## 2022-11-06 MED ORDER — ATORVASTATIN CALCIUM 20 MG PO TABS: 20.0000 mg | ORAL_TABLET | Freq: Every day | ORAL | 4 refills | Status: DC

## 2022-11-06 MED ORDER — EDARBYCLOR 40-25 MG PO TABS: 1.0000 | ORAL_TABLET | Freq: Every day | ORAL | 3 refills | Status: DC

## 2022-11-06 NOTE — Telephone Encounter (Signed)
Spoke with pt regarding this issue and she can only [facility] & [facility] are [facility],  I told her about [facility] & she will call them & see if it's in stock & we can transfer there.  Other option is TruAx but pt has to pay out of pocket & she would [facility] because her insurance pays for this.  She will call me back and let me know

## 2022-11-06 NOTE — Progress Notes (Signed)
Complete physical exam  Patient: Jade Alvarez   DOB: 1961/06/21   62 y.o. Female  MRN: [id number]  Subjective:    Chief Complaint  Patient presents with   Annual Exam    No additional concerns.     Jade Alvarez is a 62 y.o. female who presents today for a complete physical exam. She reports consuming a general diet. Home exercise routine includes yoga. She generally feels fairly well. She reports sleeping fairly well. She is concerned about getting her blood pressure medication.  Apparently they have had a Tree surgeon on that.  She just recently finished chemotherapy for her underlying colon cancer.  She has had prior to that colon cancer surgery.  She seems to have tolerated this all fairly well.  She does plan to make diet and exercise changes.  She apparently wants to become a vegan.  Review of the record indicates she does have a previous history of prediabetes blood work done in preparation for her surgery.  Review of the record also indicates she had hyperlipidemia with an LDL of 160.  She does not smoke or drink.  She has been married for 40 years. Otherwise her family and social history as well as health maintenance and immunizations was reviewed.  Most recent fall risk assessment:    11/06/2022    3:44 PM  [place] in the past year? 0  Number falls in past yr: 0  Injury with Fall? 0  Risk for fall due to : No Fall Risks  Follow up Falls evaluation completed     Most recent depression screenings:    11/06/2022    3:45 PM 10/28/2021    1:40 PM  PHQ 2/9 Scores  PHQ - 2 Score 0 0    Vision: and Dental:     Patient Care Team: Denita Lung, MD as PCP - General (Family Medicine) Truitt Merle, MD as Consulting Physician (Oncology) Royston Bake, RN as Oncology Nurse Navigator (Oncology)   Outpatient Medications Prior to Visit  Medication Sig Note   ondansetron (ZOFRAN) 8 MG tablet Take 1 tablet (8 mg total) by mouth every 8 (eight) hours as  needed for nausea or vomiting. Start on the third day after chemotherapy. (Patient not taking: Reported on 11/06/2022)    prochlorperazine (COMPAZINE) 10 MG tablet Take 1 tablet (10 mg total) by mouth every 6 (six) hours as needed for nausea or vomiting. (Patient not taking: Reported on 11/06/2022)    [DISCONTINUED] capecitabine (XELODA) 500 MG tablet TAKE 3 TABLETS BY MOUTH EVERY 12 HOURS FOR 14 DAYS, THEN OFF FOR 7 DAYS. TAKE WITHIN 30 MINUTES AFTER MEALS. (Patient not taking: Reported on 11/06/2022)    [DISCONTINUED] EDARBYCLOR 40-25 MG TABS TAKE 1 TABLET BY MOUTH ONCE DAILY (Patient taking differently: Take 1 tablet by mouth daily.) 11/06/2022: Out of mediation x 1 week     No facility-administered medications prior to visit.    Review of Systems  All other systems reviewed and are negative.         Objective:     BP (!) 168/102   Pulse 82   Temp 97.9 F (36.6 C) (Oral)   Ht '5\' 2"'$  (1.575 m)   Wt 166 lb (75.3 kg)   SpO2 98% Comment: room air  BMI 30.36 kg/m    Physical Exam  Alert and in no distress. Tympanic membranes and canals are normal. Pharyngeal area is normal. Neck is supple without adenopathy or thyromegaly.  Cardiac exam shows a regular sinus rhythm without murmurs or gallops. Lungs are clear to auscultation. Hemoglobin A1c is 6.9 Results for orders placed or performed in visit on 11/06/22  POCT glycosylated hemoglobin (Hb A1C)  Result Value Ref Range   Hemoglobin A1C 6.9 (A) 4.0 - 5.6 %       Assessment & Plan:    Routine general medical examination at a health care facility  Malignant neoplasm of sigmoid colon ([place])  Hypertension associated with diabetes ([place]) - Plan: EDARBYCLOR 40-25 MG TABS  Need for COVID-19 vaccine - Plan: [employer] Fall 2023 Covid-19 Vaccine 97yr and older  Type 2 diabetes mellitus with other specified complication, without long-term current use of insulin ([place] - Plan: POCT glycosylated hemoglobin (Hb A1C)  Hyperlipidemia associated  with type 2 diabetes mellitus ([place] - Plan: Lipid panel, atorvastatin (LIPITOR) 20 MG tablet  History of hysterectomy  Immunization History  Administered Date(s) Administered   PFIZER(Purple Top)SARS-COV-2 Vaccination 12/31/2019, 01/21/2020, 10/01/2020   Tdap 10/30/2019   Zoster Recombinat (Shingrix) 10/30/2019, 04/08/2021    Health Maintenance  Topic Date Due   Diabetic kidney evaluation - Urine ACR  Never done   MAMMOGRAM  Never done   COVID-19 Vaccine (4 - 2023-24 season) 11/22/2022 (Originally 06/16/2022)   INFLUENZA VACCINE  01/14/2023 (Originally 05/16/2022)   Diabetic kidney evaluation - eGFR measurement  09/23/2023   Fecal DNA (Cologuard)  06/24/2024   DTaP/Tdap/Td (2 - Td or Tdap) 10/29/2029   Hepatitis C Screening  Completed   HIV Screening  Completed   Zoster Vaccines- Shingrix  Completed   HPV VACCINES  Aged Out   PAP SMEAR-Modifier  Discontinued   COLONOSCOPY (Pts 45-441yrInsurance coverage will need to be confirmed)  Discontinued  I suspect that the new diagnosis of diabetes certainly was affected by her recent surgery as well as chemotherapy.  She plans to make diet and exercise changes.  Plans to become a vegan and start an exercise program.  Discussed getting down to her particular dress size.  Will also place her on Lipitor.  Discussed possible side effects of this medication.  Also discussed diabetes in regard to stroke, blindness, heart disease, renal and neurologic issues.  Recommend she call the American diabetes Association website and also return here in 1 month for recheck.  Problem List Items Addressed This Visit     [facility]  Other Visit Diagnoses     Routine general medical examination at a health care facility    -  Primary   Essential hypertension       Relevant Medications   EDARBYCLOR 40-25 MG TABS   atorvastatin (LIPITOR) 20 MG tablet   Need for COVID-19 vaccine       Relevant Orders   [employer] Fall 2023 Covid-19 Vaccine 1226yrnd older    Type 2 diabetes mellitus with other specified complication, without long-term current use of insulin (H[facility])       Relevant Medications   EDARBYCLOR 40-25 MG TABS   atorvastatin (LIPITOR) 20 MG tablet   Other Relevant Orders   POCT glycosylated hemoglobin (Hb A1C) (Completed)   Hyperlipidemia, unspecified hyperlipidemia type       Relevant Medications   EDARBYCLOR 40-25 MG TABS   atorvastatin (LIPITOR) 20 MG tablet   Other Relevant Orders   Lipid panel         JohJill AlexandersD

## 2022-11-06 NOTE — Patient Instructions (Signed)
Go to the American diabetes Association website and read about diabetes

## 2022-11-07 LAB — LIPID PANEL
Chol/HDL Ratio: 3.9 ratio (ref 0.0–4.4)
Cholesterol, Total: 271 mg/dL — ABNORMAL HIGH (ref 100–199)
HDL: 70 mg/dL (ref >39)
LDL Chol Calc (NIH): 178 mg/dL — ABNORMAL HIGH (ref 0–99)
Triglycerides: 128 mg/dL (ref 0–149)
VLDL Cholesterol Cal: 23 mg/dL (ref 5–40)

## 2022-11-07 MED ORDER — ATORVASTATIN CALCIUM 40 MG PO TABS: 40.0000 mg | ORAL_TABLET | Freq: Every day | ORAL | 3 refills | Status: AC

## 2022-11-07 NOTE — Addendum Note (Signed)
Addended by: Denita Lung on: 11/07/2022 12:40 PM   Modules accepted: Orders

## 2022-11-24 ENCOUNTER — Encounter: Payer: 59 | Admitting: Family Medicine

## 2022-12-08 ENCOUNTER — Ambulatory Visit
Admission: RE | Admit: 2022-12-08 | Discharge: 2022-12-08 | Disposition: A | Discharge status: Home / Self Care | Payer: 59 | Source: Ambulatory Visit | Attending: Hematology | Admitting: Hematology

## 2022-12-08 ENCOUNTER — Encounter: Payer: Self-pay | Admitting: Hematology

## 2022-12-08 DIAGNOSIS — Z1231 Encounter for screening mammogram for malignant neoplasm of breast: Secondary | ICD-10-CM

## 2022-12-13 ENCOUNTER — Other Ambulatory Visit: Payer: Self-pay | Admitting: Hematology

## 2022-12-13 DIAGNOSIS — R928 Other abnormal and inconclusive findings on diagnostic imaging of breast: Secondary | ICD-10-CM

## 2022-12-14 ENCOUNTER — Telehealth: Payer: Self-pay | Admitting: Family Medicine

## 2022-12-14 NOTE — Telephone Encounter (Signed)
Pt left message that needs P.A. for Edarbyclor asap (KEY [id number])

## 2022-12-18 MED ORDER — LOSARTAN POTASSIUM-HCTZ 100-12.5 MG PO TABS: 1.0000 | ORAL_TABLET | Freq: Every day | ORAL | 0 refills | Status: DC

## 2022-12-18 NOTE — Telephone Encounter (Signed)
P.A. EDARBYCLOR denied, pt must try & fail  3 alternatives. Losartan/HCTZ, olmesartan/HCTZ, telmisartan/HCTZ, valsartan/HCTZ.  Called pt and informed & told her about mail order Truax to pay out of pocket $55 a month for Edarbyclor and she says she can't afford that & would like to try one of the generic instead.  Please switch and send back to me to call patient.

## 2022-12-19 ENCOUNTER — Other Ambulatory Visit: Payer: Self-pay | Admitting: Nurse Practitioner

## 2022-12-19 ENCOUNTER — Telehealth: Payer: Self-pay

## 2022-12-19 DIAGNOSIS — C187 Malignant neoplasm of sigmoid colon: Secondary | ICD-10-CM

## 2022-12-19 NOTE — Telephone Encounter (Signed)
Spoke with pt via telephone regarding pt's CT Scan that was scheduled on 12/20/2022.  Informed pt that the pt's CT Scan was canceled d/t pt's insurance denied the CT Scan being done at [facility].  Informed pt that her insurance approved for it to be done at [facility].  Pt was highly upset and stated that this is "MESSY" due to pt had taking off work just to have the CT Scan done.  Apologized to pt about the inconvenience this had caused her.  Pt was still upset and stated she received a call from GI trying to schedule the appt but told them that she is already scheduled on 12/20/2022 at [facility] not knowing that the appt at [facility] was canceled.  Pt stated she will call GI back to get the CT Scan scheduled.  Apologized again for the inconvenience.  Pt had no further questions or concerns.  Reached out to [employer] and Otilio Carpen regarding the [employer] for getting this CT Scan authorization/submission to pt's insurance earlier than 2 days before the actual CT Scan is scheduled.  Will ask for Gaspar Bidding to please contact pt for resolution regarding this process.

## 2022-12-20 ENCOUNTER — Ambulatory Visit (HOSPITAL_COMMUNITY): Payer: 59

## 2022-12-20 ENCOUNTER — Other Ambulatory Visit: Payer: Self-pay

## 2022-12-21 ENCOUNTER — Other Ambulatory Visit: Payer: Self-pay

## 2022-12-22 ENCOUNTER — Inpatient Hospital Stay: Payer: 59 | Admitting: Hematology

## 2022-12-22 ENCOUNTER — Inpatient Hospital Stay: Payer: 59

## 2022-12-26 NOTE — Telephone Encounter (Signed)
Pt informed, she will call back and schedule appt

## 2023-01-10 ENCOUNTER — Telehealth: Payer: Self-pay | Admitting: Family Medicine

## 2023-01-10 ENCOUNTER — Encounter (HOSPITAL_COMMUNITY): Payer: Self-pay | Admitting: *Deleted

## 2023-01-10 ENCOUNTER — Other Ambulatory Visit: Payer: Self-pay

## 2023-01-10 ENCOUNTER — Emergency Department (HOSPITAL_COMMUNITY)
Admission: EM | Admit: 2023-01-10 | Discharge: 2023-01-11 | Disposition: A | Discharge status: Home / Self Care | Payer: Self-pay | Attending: Emergency Medicine | Admitting: Emergency Medicine

## 2023-01-10 DIAGNOSIS — C189 Malignant neoplasm of colon, unspecified: Secondary | ICD-10-CM | POA: Insufficient documentation

## 2023-01-10 DIAGNOSIS — I10 Essential (primary) hypertension: Secondary | ICD-10-CM | POA: Insufficient documentation

## 2023-01-10 DIAGNOSIS — Z79899 Other long term (current) drug therapy: Secondary | ICD-10-CM | POA: Insufficient documentation

## 2023-01-10 DIAGNOSIS — R911 Solitary pulmonary nodule: Secondary | ICD-10-CM | POA: Insufficient documentation

## 2023-01-10 DIAGNOSIS — N63 Unspecified lump in unspecified breast: Secondary | ICD-10-CM | POA: Insufficient documentation

## 2023-01-10 DIAGNOSIS — M546 Pain in thoracic spine: Secondary | ICD-10-CM | POA: Insufficient documentation

## 2023-01-10 NOTE — Telephone Encounter (Signed)
Pt. Called back and stated to call her back on her cell phone now because she is leaving work.

## 2023-01-10 NOTE — Telephone Encounter (Signed)
Tailyn called and states she thinks the losartan she is taking is making her have really bad back spasms. Does she need an appointment or can this medication be changed?

## 2023-01-10 NOTE — ED Triage Notes (Signed)
The pt is c/o lower back pain for 3 weeks    no know injury

## 2023-01-10 NOTE — Telephone Encounter (Signed)
Also pt requests to be called back at her work number [phone]

## 2023-01-11 ENCOUNTER — Emergency Department (HOSPITAL_COMMUNITY): Payer: Self-pay

## 2023-01-11 LAB — COMPREHENSIVE METABOLIC PANEL
ALT: 26 U/L (ref 0–44)
AST: 26 U/L (ref 15–41)
Albumin: 3.8 g/dL (ref 3.5–5.0)
Alkaline Phosphatase: 69 U/L (ref 38–126)
Anion gap: 11 (ref 5–15)
BUN: 14 mg/dL (ref 8–23)
CO2: 26 mmol/L (ref 22–32)
Calcium: 9.2 mg/dL (ref 8.9–10.3)
Chloride: 102 mmol/L (ref 98–111)
Creatinine, Ser: 1 mg/dL (ref 0.44–1.00)
GFR, Estimated: >60 mL/min (ref >60)
Glucose, Bld: 100 mg/dL — ABNORMAL HIGH (ref 70–99)
Potassium: 3.4 mmol/L — ABNORMAL LOW (ref 3.5–5.1)
Sodium: 139 mmol/L (ref 135–145)
Total Bilirubin: 0.6 mg/dL (ref 0.3–1.2)
Total Protein: 7.5 g/dL (ref 6.5–8.1)

## 2023-01-11 LAB — CBC WITH DIFFERENTIAL/PLATELET
Abs Immature Granulocytes: 0.03 10*3/uL (ref 0.00–0.07)
Basophils Absolute: 0 10*3/uL (ref 0.0–0.1)
Basophils Relative: 0 %
Eosinophils Absolute: 0.2 10*3/uL (ref 0.0–0.5)
Eosinophils Relative: 2 %
HCT: 39.5 % (ref 36.0–46.0)
Hemoglobin: 13 g/dL (ref 12.0–15.0)
Immature Granulocytes: 0 %
Lymphocytes Relative: 28 %
Lymphs Abs: 2.5 10*3/uL (ref 0.7–4.0)
MCH: 31.1 pg (ref 26.0–34.0)
MCHC: 32.9 g/dL (ref 30.0–36.0)
MCV: 94.5 fL (ref 80.0–100.0)
Monocytes Absolute: 0.9 10*3/uL (ref 0.1–1.0)
Monocytes Relative: 10 %
Neutro Abs: 5.1 10*3/uL (ref 1.7–7.7)
Neutrophils Relative %: 60 %
Platelets: 322 10*3/uL (ref 150–400)
RBC: 4.18 MIL/uL (ref 3.87–5.11)
RDW: 13.1 % (ref 11.5–15.5)
WBC: 8.8 10*3/uL (ref 4.0–10.5)
nRBC: 0 % (ref 0.0–0.2)

## 2023-01-11 LAB — LIPASE, BLOOD: Lipase: 38 U/L (ref 11–51)

## 2023-01-11 MED ORDER — IOHEXOL 350 MG/ML SOLN
75.0000 mL | Freq: Once | INTRAVENOUS | Status: AC | PRN
  Administered 2023-01-11: 75 mL via INTRAVENOUS

## 2023-01-11 MED ORDER — DIAZEPAM 2 MG PO TABS
2.0000 mg | ORAL_TABLET | Freq: Once | ORAL | Status: AC
  Administered 2023-01-11: 2 mg via ORAL
  Filled 2023-01-11: qty 1

## 2023-01-11 MED ORDER — METHOCARBAMOL 500 MG PO TABS
500.0000 mg | ORAL_TABLET | Freq: Once | ORAL | Status: AC
  Administered 2023-01-11: 500 mg via ORAL
  Filled 2023-01-11: qty 1

## 2023-01-11 MED ORDER — KETOROLAC TROMETHAMINE 15 MG/ML IJ SOLN
15.0000 mg | Freq: Once | INTRAMUSCULAR | Status: AC
  Administered 2023-01-11: 15 mg via INTRAVENOUS
  Filled 2023-01-11: qty 1

## 2023-01-11 MED ORDER — ACETAMINOPHEN 500 MG PO TABS
1000.0000 mg | ORAL_TABLET | Freq: Once | ORAL | Status: AC
  Administered 2023-01-11: 1000 mg via ORAL
  Filled 2023-01-11: qty 2

## 2023-01-11 MED ORDER — CYCLOBENZAPRINE HCL 10 MG PO TABS: 10.0000 mg | ORAL_TABLET | Freq: Two times a day (BID) | ORAL | 0 refills | Status: DC | PRN

## 2023-01-11 NOTE — ED Provider Notes (Signed)
[facility] Provider Note   CSN: [id number] Arrival date & time: 01/10/23  2328     History  Chief Complaint  Patient presents with   Back Pain    Jade Alvarez is a 62 y.o. female.  62 year old female with history of colon cancer hypertension status post chemotherapy presents ER today with nontraumatic back pain.  Patient states been going on for 2 or 3 weeks.  She thought she may have just overdone it in the yard planting gardens and stuff however it persisted even with rest and conservative treatments.  She states it feels, like a pulled muscle in the left upper back.  Worse with breathing.  No shortness of breath or lower extremity swelling.  No history of blood clots.  Of note she is scheduled to have a CT scan on the fifth.   Back Pain      Home Medications Prior to Admission medications   Medication Sig Start Date End Date Taking? Authorizing Provider  cyclobenzaprine (FLEXERIL) 10 MG tablet Take 1 tablet (10 mg total) by mouth 2 (two) times daily as needed for muscle spasms. 01/11/23  Yes Sammie Denner, Corene Cornea, MD  atorvastatin (LIPITOR) 40 MG tablet Take 1 tablet (40 mg total) by mouth daily. 11/07/22   Denita Lung, MD  losartan-hydrochlorothiazide (HYZAAR) 100-12.5 MG tablet Take 1 tablet by mouth daily. 12/18/22   Denita Lung, MD  ondansetron (ZOFRAN) 8 MG tablet Take 1 tablet (8 mg total) by mouth every 8 (eight) hours as needed for nausea or vomiting. Start on the third day after chemotherapy. Patient not taking: Reported on 11/06/2022 06/06/22   Truitt Merle, MD  prochlorperazine (COMPAZINE) 10 MG tablet Take 1 tablet (10 mg total) by mouth every 6 (six) hours as needed for nausea or vomiting. Patient not taking: Reported on 11/06/2022 06/06/22   Truitt Merle, MD      Allergies    Penicillins    Review of Systems   Review of Systems  Musculoskeletal:  Positive for back pain.    Physical Exam Updated Vital Signs BP (!)  179/94 (BP Location: Right Arm)   Pulse 77   Temp 97.9 F (36.6 C) (Oral)   Resp 20   Ht 5\' 2"  (1.575 m)   Wt 75.3 kg   SpO2 98%   BMI 30.36 kg/m  Physical Exam Vitals and nursing note reviewed.  Constitutional:      Appearance: She is well-developed.  HENT:     Head: Normocephalic and atraumatic.  Eyes:     Pupils: Pupils are equal, round, and reactive to light.  Cardiovascular:     Rate and Rhythm: Normal rate and regular rhythm.  Pulmonary:     Effort: No respiratory distress.     Breath sounds: No stridor.  Abdominal:     General: Abdomen is flat. There is no distension.  Musculoskeletal:        General: Normal range of motion.     Cervical back: Normal range of motion.  Skin:    General: Skin is warm.  Neurological:     General: No focal deficit present.     Mental Status: She is alert.     ED Results / Procedures / Treatments   Labs (all labs ordered are listed, but only abnormal results are displayed) Labs Reviewed  COMPREHENSIVE METABOLIC PANEL - Abnormal; Notable for the following components:      Result Value   Potassium 3.4 (*)  Glucose, Bld 100 (*)    All other components within normal limits  CBC WITH DIFFERENTIAL/PLATELET  LIPASE, BLOOD    EKG None  Radiology CT Angio Chest PE W and/or Wo Contrast  Result Date: 01/11/2023 CLINICAL DATA:  Abdominal pain, acute, localized. Lower back pain for 3 weeks. No known injury. Pulmonary embolism suspected, high probability. EXAM: CT ANGIOGRAPHY CHEST CT ABDOMEN AND PELVIS WITH CONTRAST TECHNIQUE: Multidetector CT imaging of the chest was performed using the standard protocol during bolus administration of intravenous contrast. Multiplanar CT image reconstructions and MIPs were obtained to evaluate the vascular anatomy. Multidetector CT imaging of the abdomen and pelvis was performed using the standard protocol during bolus administration of intravenous contrast. RADIATION DOSE REDUCTION: This exam was  performed according to the departmental dose-optimization program which includes automated exposure control, adjustment of the mA and/or kV according to patient size and/or use of iterative reconstruction technique. CONTRAST:  71mL OMNIPAQUE IOHEXOL 350 MG/ML SOLN COMPARISON:  03/17/2022, tooth 01/03/2023. FINDINGS: CTA CHEST FINDINGS Cardiovascular: The heart is enlarged and there is no pericardial effusion. Few scattered coronary artery calcifications are noted. There is atherosclerotic calcification of the aorta without evidence of aneurysm. The pulmonary trunk is normal in caliber. No pulmonary artery filling defect is seen. Mediastinum/Nodes: No mediastinal, hilar, or axillary lymphadenopathy. The thyroid gland, trachea, and esophagus are within normal limits. There is a small hiatal hernia. Lungs/Pleura: Mild atelectasis is present bilaterally. No effusion or pneumothorax. A 5 mm nodule is present in the left lower lobe, axial image 80. A 4 mm nodule is noted in the right upper lobe, axial image 59. Additional 2 mm pulmonary nodules are noted bilaterally. Musculoskeletal: A 9 mm nodule is noted in the right breast. No acute osseous abnormality. Review of the MIP images confirms the above findings. CT ABDOMEN and PELVIS FINDINGS Hepatobiliary: No focal liver abnormality is seen. No gallstones, gallbladder wall thickening, or biliary dilatation. Pancreas: Unremarkable. No pancreatic ductal dilatation or surrounding inflammatory changes. Spleen: Normal in size without focal abnormality. Adrenals/Urinary Tract: The adrenal glands are within normal limits. The kidneys enhance symmetrically. No renal calculus or hydronephrosis. The bladder is unremarkable. Stomach/Bowel: Small hiatal hernia is present. Stomach is otherwise within normal limits. Appendix appears normal. No evidence of bowel wall thickening, distention, or inflammatory changes. No free air or pneumatosis. Vascular/Lymphatic: Aortic atherosclerosis. No  enlarged abdominal or pelvic lymph nodes. Reproductive: Status post hysterectomy. No adnexal masses. Other: No abdominopelvic ascites. Musculoskeletal: No acute or suspicious osseous abnormality. Review of the MIP images confirms the above findings. IMPRESSION: 1. No evidence of pulmonary embolism or other acute process in the chest. 2. No acute process in the abdomen and pelvis. 3. Scattered pulmonary nodules in the lungs bilaterally measuring up to 4 mm. No follow-up needed if patient is low-risk (and has no known or suspected primary neoplasm). Non-contrast chest CT can be considered in 12 months if patient is high-risk. This recommendation follows the consensus statement: Guidelines for Management of Incidental Pulmonary Nodules Detected on CT Images: From the Fleischner Society 2017; Radiology 2017; 284:228-243. 4. Small hiatal hernia. 5. 9 mm right breast nodule, corresponding to recent mammogram. Diagnostic ultrasound is recommended for further evaluation. 6. Aortic atherosclerosis and coronary artery calcifications. Electronically Signed   By: Brett Fairy M.D.   On: 01/11/2023 02:13   CT ABDOMEN PELVIS W CONTRAST  Result Date: 01/11/2023 CLINICAL DATA:  Abdominal pain, acute, localized. Lower back pain for 3 weeks. No known injury. Pulmonary embolism suspected, high probability.  EXAM: CT ANGIOGRAPHY CHEST CT ABDOMEN AND PELVIS WITH CONTRAST TECHNIQUE: Multidetector CT imaging of the chest was performed using the standard protocol during bolus administration of intravenous contrast. Multiplanar CT image reconstructions and MIPs were obtained to evaluate the vascular anatomy. Multidetector CT imaging of the abdomen and pelvis was performed using the standard protocol during bolus administration of intravenous contrast. RADIATION DOSE REDUCTION: This exam was performed according to the departmental dose-optimization program which includes automated exposure control, adjustment of the mA and/or kV  according to patient size and/or use of iterative reconstruction technique. CONTRAST:  47mL OMNIPAQUE IOHEXOL 350 MG/ML SOLN COMPARISON:  03/17/2022, tooth 01/03/2023. FINDINGS: CTA CHEST FINDINGS Cardiovascular: The heart is enlarged and there is no pericardial effusion. Few scattered coronary artery calcifications are noted. There is atherosclerotic calcification of the aorta without evidence of aneurysm. The pulmonary trunk is normal in caliber. No pulmonary artery filling defect is seen. Mediastinum/Nodes: No mediastinal, hilar, or axillary lymphadenopathy. The thyroid gland, trachea, and esophagus are within normal limits. There is a small hiatal hernia. Lungs/Pleura: Mild atelectasis is present bilaterally. No effusion or pneumothorax. A 5 mm nodule is present in the left lower lobe, axial image 80. A 4 mm nodule is noted in the right upper lobe, axial image 59. Additional 2 mm pulmonary nodules are noted bilaterally. Musculoskeletal: A 9 mm nodule is noted in the right breast. No acute osseous abnormality. Review of the MIP images confirms the above findings. CT ABDOMEN and PELVIS FINDINGS Hepatobiliary: No focal liver abnormality is seen. No gallstones, gallbladder wall thickening, or biliary dilatation. Pancreas: Unremarkable. No pancreatic ductal dilatation or surrounding inflammatory changes. Spleen: Normal in size without focal abnormality. Adrenals/Urinary Tract: The adrenal glands are within normal limits. The kidneys enhance symmetrically. No renal calculus or hydronephrosis. The bladder is unremarkable. Stomach/Bowel: Small hiatal hernia is present. Stomach is otherwise within normal limits. Appendix appears normal. No evidence of bowel wall thickening, distention, or inflammatory changes. No free air or pneumatosis. Vascular/Lymphatic: Aortic atherosclerosis. No enlarged abdominal or pelvic lymph nodes. Reproductive: Status post hysterectomy. No adnexal masses. Other: No abdominopelvic ascites.  Musculoskeletal: No acute or suspicious osseous abnormality. Review of the MIP images confirms the above findings. IMPRESSION: 1. No evidence of pulmonary embolism or other acute process in the chest. 2. No acute process in the abdomen and pelvis. 3. Scattered pulmonary nodules in the lungs bilaterally measuring up to 4 mm. No follow-up needed if patient is low-risk (and has no known or suspected primary neoplasm). Non-contrast chest CT can be considered in 12 months if patient is high-risk. This recommendation follows the consensus statement: Guidelines for Management of Incidental Pulmonary Nodules Detected on CT Images: From the Fleischner Society 2017; Radiology 2017; 284:228-243. 4. Small hiatal hernia. 5. 9 mm right breast nodule, corresponding to recent mammogram. Diagnostic ultrasound is recommended for further evaluation. 6. Aortic atherosclerosis and coronary artery calcifications. Electronically Signed   By: Brett Fairy M.D.   On: 01/11/2023 02:13    Procedures Procedures    Medications Ordered in ED Medications  diazepam (VALIUM) tablet 2 mg (has no administration in time range)  methocarbamol (ROBAXIN) tablet 500 mg (500 mg Oral Given 01/11/23 0031)  acetaminophen (TYLENOL) tablet 1,000 mg (1,000 mg Oral Given 01/11/23 0029)  iohexol (OMNIPAQUE) 350 MG/ML injection 75 mL (75 mLs Intravenous Contrast Given 01/11/23 0155)  ketorolac (TORADOL) 15 MG/ML injection 15 mg (15 mg Intravenous Given 01/11/23 0229)    ED Course/ Medical Decision Making/ A&P  Medical Decision Making Amount and/or Complexity of Data Reviewed Labs: ordered. Radiology: ordered.  Risk OTC drugs. Prescription drug management.  Very well could be just a pulled muscle however the history of cancer have to consider pulmonary embolus versus metastatic disease.  She is scheduled for CT scan next week anyway so we will just do a PET scan with her abdomen pelvis here and lieu of that but  also to rule out any emergent causes for pain.  Will treat symptomatically in the meantime. CT CAP done and showed no obvious PE, osseous lesions or bowel obstruction (independently viewed and interpreted by myself and radiology read reviewed).  CT scans reassuring from metastatic disease perspective.  Does have some nodules that need to be followed up patient is aware of these.  Suspect likely muscular cause at this time.  Will treat for the same.  Final Clinical Impression(s) / ED Diagnoses Final diagnoses:  Acute left-sided thoracic back pain  Breast nodule  Pulmonary nodule    Rx / DC Orders ED Discharge Orders          Ordered    cyclobenzaprine (FLEXERIL) 10 MG tablet  2 times daily PRN        01/11/23 0239              Tamantha Saline, Corene Cornea, MD 01/11/23 [phone]

## 2023-01-11 NOTE — Telephone Encounter (Signed)
Tried to call pt but vm is full 

## 2023-01-12 ENCOUNTER — Encounter: Payer: Self-pay | Admitting: Hematology

## 2023-01-15 ENCOUNTER — Encounter: Payer: Self-pay | Admitting: Hematology

## 2023-01-15 ENCOUNTER — Encounter: Payer: Self-pay | Admitting: Nurse Practitioner

## 2023-01-15 NOTE — Telephone Encounter (Signed)
Tried to call pt but vm is ful

## 2023-01-16 ENCOUNTER — Other Ambulatory Visit: Payer: Self-pay

## 2023-01-16 NOTE — Telephone Encounter (Signed)
Pt went to ER and is better now as she had a pulled muscle

## 2023-01-18 ENCOUNTER — Telehealth: Payer: Self-pay | Admitting: Hematology

## 2023-01-18 NOTE — Telephone Encounter (Signed)
Patient called to reschedule 4/8 appointment due to work conflict. Patient rescheduled to next available that works with her work schedule. Patient notified.

## 2023-01-19 ENCOUNTER — Inpatient Hospital Stay: Admission: RE | Admit: 2023-01-19 | Payer: 59 | Source: Ambulatory Visit

## 2023-01-19 ENCOUNTER — Other Ambulatory Visit: Payer: Self-pay

## 2023-01-22 ENCOUNTER — Inpatient Hospital Stay: Payer: PRIVATE HEALTH INSURANCE

## 2023-01-22 ENCOUNTER — Inpatient Hospital Stay: Payer: PRIVATE HEALTH INSURANCE | Admitting: Nurse Practitioner

## 2023-02-01 ENCOUNTER — Telehealth: Payer: Self-pay

## 2023-02-01 NOTE — Telephone Encounter (Signed)
Message Received: Today Jade Alvarez, CMA sent to Jade Alvarez, CMA Tried calling patient again. Unable to leave voice message to due her voice mailbox full. Multiple attempts have been made on different days to reach patient, but she has not returned our call. Will mail a letter to home address.       Previous Messages    ----- Message ----- From: Jade Alvarez, CMA Sent: 01/18/2023   1:03 PM EDT To: Jade Alvarez, CMA  Tried calling patient. Voice mailbox full. Unable to leave message.   ----- Message ----- From: Jade Alvarez, CMA Sent: 01/17/2023   4:54 PM EDT To: Jade Alvarez, CMA  Tried calling patient to schedule hospital follow up appointment. No answer. Unable to leave message due to patients voice mailbox full.  ----- Message ----- From: Ronnald Nian, MD Sent: 01/11/2023   1:29 PM EDT To: Jade Alvarez, CMA  Make sure she gets scheduled for follow-up appointment ----- Message ----- From: Marily Memos, MD Sent: 01/11/2023   6:51 AM EDT To: Ronnald Nian, MD

## 2023-02-09 ENCOUNTER — Other Ambulatory Visit: Payer: Self-pay

## 2023-02-09 ENCOUNTER — Inpatient Hospital Stay: Payer: Self-pay | Attending: Hematology

## 2023-02-09 ENCOUNTER — Encounter: Payer: Self-pay | Admitting: Hematology

## 2023-02-09 ENCOUNTER — Inpatient Hospital Stay (HOSPITAL_BASED_OUTPATIENT_CLINIC_OR_DEPARTMENT_OTHER): Payer: Self-pay | Admitting: Hematology

## 2023-02-09 VITALS — BP 173/93 | HR 82 | Temp 98.5°F | Resp 18 | Ht 62.0 in | Wt 169.8 lb

## 2023-02-09 DIAGNOSIS — Z85038 Personal history of other malignant neoplasm of large intestine: Secondary | ICD-10-CM | POA: Insufficient documentation

## 2023-02-09 DIAGNOSIS — Z90710 Acquired absence of both cervix and uterus: Secondary | ICD-10-CM | POA: Insufficient documentation

## 2023-02-09 DIAGNOSIS — C187 Malignant neoplasm of sigmoid colon: Secondary | ICD-10-CM

## 2023-02-09 DIAGNOSIS — Z79899 Other long term (current) drug therapy: Secondary | ICD-10-CM | POA: Insufficient documentation

## 2023-02-09 DIAGNOSIS — Z9221 Personal history of antineoplastic chemotherapy: Secondary | ICD-10-CM | POA: Insufficient documentation

## 2023-02-09 LAB — CBC WITH DIFFERENTIAL/PLATELET
Abs Immature Granulocytes: 0.02 10*3/uL (ref 0.00–0.07)
Basophils Absolute: 0 10*3/uL (ref 0.0–0.1)
Basophils Relative: 0 %
Eosinophils Absolute: 0.1 10*3/uL (ref 0.0–0.5)
Eosinophils Relative: 2 %
HCT: 41.2 % (ref 36.0–46.0)
Hemoglobin: 13.3 g/dL (ref 12.0–15.0)
Immature Granulocytes: 0 %
Lymphocytes Relative: 29 %
Lymphs Abs: 2.2 10*3/uL (ref 0.7–4.0)
MCH: 30.4 pg (ref 26.0–34.0)
MCHC: 32.3 g/dL (ref 30.0–36.0)
MCV: 94.3 fL (ref 80.0–100.0)
Monocytes Absolute: 0.7 10*3/uL (ref 0.1–1.0)
Monocytes Relative: 9 %
Neutro Abs: 4.5 10*3/uL (ref 1.7–7.7)
Neutrophils Relative %: 60 %
Platelets: 301 10*3/uL (ref 150–400)
RBC: 4.37 MIL/uL (ref 3.87–5.11)
RDW: 12.9 % (ref 11.5–15.5)
WBC: 7.6 10*3/uL (ref 4.0–10.5)
nRBC: 0 % (ref 0.0–0.2)

## 2023-02-09 LAB — COMPREHENSIVE METABOLIC PANEL
ALT: 17 U/L (ref 0–44)
AST: 19 U/L (ref 15–41)
Albumin: 4.6 g/dL (ref 3.5–5.0)
Alkaline Phosphatase: 79 U/L (ref 38–126)
Anion gap: 8 (ref 5–15)
BUN: 14 mg/dL (ref 8–23)
CO2: 30 mmol/L (ref 22–32)
Calcium: 9.8 mg/dL (ref 8.9–10.3)
Chloride: 102 mmol/L (ref 98–111)
Creatinine, Ser: 1.03 mg/dL — ABNORMAL HIGH (ref 0.44–1.00)
GFR, Estimated: >60 mL/min (ref >60)
Glucose, Bld: 92 mg/dL (ref 70–99)
Potassium: 3.8 mmol/L (ref 3.5–5.1)
Sodium: 140 mmol/L (ref 135–145)
Total Bilirubin: 0.5 mg/dL (ref 0.3–1.2)
Total Protein: 7.9 g/dL (ref 6.5–8.1)

## 2023-02-09 LAB — CEA (IN HOUSE-CHCC): CEA (CHCC-In House): 0.93 ng/mL (ref 0.00–5.00)

## 2023-02-09 NOTE — Assessment & Plan Note (Addendum)
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 Colonoscopy on 03/03/22 showed partially obstructing tumor in proximal sigmoid colon, path confirmed adenocarcinoma. -s/p resection 04/28/22 -s/p adjuvant chemo CAPOX 05/22/2022-08/24/2022, overall did not tolerate well and required dose reduction  -on cancer surveillance now.  -She is clinically doing well, exam unremarkable, lab reviewed, no clinical concern for recurrence. -She was evaluated in the ED on January 11, 2023 for acute left chest pain, CT chest, abdomen and pelvis with contrast was obtained, which showed no PE or other acute process, no evidence of cancer recurrence.  I reviewed the CT scan images and discussed the findings with her.

## 2023-02-09 NOTE — Progress Notes (Signed)
[facility]   [phone] [phone]   Clinic Follow up Note   Patient Care Team: Ronnald Nian, MD as PCP - General (Family Medicine) Malachy Mood, MD as Consulting Physician (Oncology) Marily Lente, RN as Oncology Nurse Navigator (Oncology) Diagnostic Radiology & Imaging, [facility] as Attending Physician (Diagnostic Radiology)  Date of Service:  02/09/2023  CHIEF COMPLAINT: f/u of colon cancer   CURRENT THERAPY:  Surveillance  ASSESSMENT:  Jade Alvarez is a 62 y.o. 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 Colonoscopy on 03/03/22 showed partially obstructing tumor in proximal sigmoid colon, path confirmed adenocarcinoma. -s/p resection 04/28/22 -s/p adjuvant chemo CAPOX 05/22/2022-08/24/2022, overall did not tolerate well and required dose reduction  -on cancer surveillance now.  -She is clinically doing well, exam unremarkable, lab reviewed, no clinical concern for recurrence. -She was evaluated in the ED on January 11, 2023 for acute left chest pain, CT chest, abdomen and pelvis with contrast was obtained, which showed no PE or other acute process, no evidence of cancer recurrence.  I reviewed the CT scan images and discussed the findings with her.    PLAN: -lab reviewed -CT scan reviewed, NED - repeat CT scan in 1 year. -recommend Colonoscopy in 3 months, will copy Dr. Tomasa Rand   -Continue  Cancer Surveillance -lab and f/u in 4 months   SUMMARY OF ONCOLOGIC HISTORY: Oncology History Overview Note   Cancer Staging  Colon cancer [facility]) Staging form: Colon and Rectum, AJCC 8th Edition - Pathologic stage from 04/28/2022: Stage IIIB (pT3, pN1b, cM0) - Signed by Malachy Mood, MD on 05/18/2022    Colon cancer (HCC)  06/23/2021 Miscellaneous   Cologuard - Positive   03/03/2022 Procedure   Colonoscopy, Dr. Tomasa Rand  Impression: - Rule out malignancy, partially obstructing tumor in the  proximal sigmoid colon. Biopsied. Tattooed. - Two 4 to 7 mm polyps in the transverse colon, removed with a cold snare. Resected and retrieved. - One 4 mm polyp at the hepatic flexure, removed with a cold snare. Resected and retrieved. - One 4 mm polyp at the splenic flexure, removed with a cold snare. Resected and retrieved. - One 3 mm polyp in the descending colon, removed with a cold snare. Resected and retrieved. - One 2 mm polyp in the rectum, removed with a cold snare. Resected and retrieved. - The examined portion of the ileum was normal. - The distal rectum and anal verge are normal on retroflexion view.   03/03/2022 Initial Biopsy   Diagnosis 1. Hepatic Flexure Biopsy, transverse, splenic flexure, descending, rectal, polyps (6) TUBULAR ADENOMAS. NEGATIVE FOR HIGH-GRADE DYSPLASIA. 2. Sigmoid Colon Biopsy INVASIVE ADENOCARCINOMA, MODERATELY DIFFERENTIATED (GRADE 2).   03/17/2022 Imaging   EXAM: CT CHEST, ABDOMEN, AND PELVIS WITH CONTRAST  IMPRESSION: 1. Short segment wall thickening of the descending colon which possibly reflects patient's known primary colonic neoplasm. 2. Prominent mesenteric lymph nodes adjacent to the area of descending colonic wall thickening measure up to 8 mm and likely reflect local nodal involvement. 3. No evidence of distant metastatic disease in the chest, abdomen or pelvis.   04/28/2022 Initial Diagnosis   Colon cancer (HCC)   04/28/2022 Cancer Staging   Staging form: Colon and Rectum, AJCC 8th Edition - Pathologic stage from 04/28/2022: Stage IIIB (pT3, pN1b, cM0) - Signed by Malachy Mood, MD on 05/18/2022 Stage prefix: Initial diagnosis Histologic grading system: 4 grade system Histologic grade (G): G2 Residual tumor (R): R0 -  None   04/28/2022 Definitive Surgery   FINAL MICROSCOPIC DIAGNOSIS:   A. COLON, LEFT, RESECTION:  - Invasive colonic adenocarcinoma, moderately differentiated, 3.3 cm.  - Carcinoma extends into pericolonic connective  tissue.  - All surgical margins negative for carcinoma.  - Metastatic carcinoma in three of twenty-three lymph nodes (3/23) with extranodal extension.  - One satellite tumor nodule.  - See oncology table.    06/08/2022 - 06/08/2022 Chemotherapy   Patient is on Treatment Plan : COLORECTAL Xelox (Capeox) q21d     06/08/2022 -  Chemotherapy   Patient is on Treatment Plan : RECTAL Xelox (Capeox) (130/850) q21d x 6 cycles        INTERVAL HISTORY:  Jade Alvarez is here for a follow up of colon cancer. She was last seen by me on 10/12/2022. She presents to the clinic accompanied by husband. Pt state that she has no issues with BM, no bleeding, Pt state she has some numbness and tingling but not effecting her ability to do anything. Pt went to the ED for back pain.   All other systems were reviewed with the patient and are negative.  MEDICAL HISTORY:  Past Medical History:  Diagnosis Date   Anemia    PAST HX - NO ISSUES SINCE PARTIAL HYSTERECTOMY   Blood transfusion without reported diagnosis    PRIOR TO HYSTERECTOMY   Colon cancer (HCC) 04/28/2022   Hypertension    Positive colorectal cancer screening using Cologuard test 06/24/2021    SURGICAL HISTORY: Past Surgical History:  Procedure Laterality Date   ABDOMINAL HYSTERECTOMY     PARTIAL   CESAREAN SECTION     x2   COLONOSCOPY  03/03/2022   FLEXIBLE SIGMOIDOSCOPY N/A 04/28/2022   Procedure: FLEXIBLE SIGMOIDOSCOPY;  Surgeon: Andria Meuse, MD;  Location: [facility];  Service: General;  Laterality: N/A;   WISDOM TOOTH EXTRACTION      I have reviewed the social history and family history with the patient and they are unchanged from previous note.  ALLERGIES:  is allergic to penicillins.  MEDICATIONS:  Current Outpatient Medications  Medication Sig Dispense Refill   atorvastatin (LIPITOR) 40 MG tablet Take 1 tablet (40 mg total) by mouth daily. 90 tablet 3   cyclobenzaprine (FLEXERIL) 10 MG tablet Take 1 tablet (10 mg  total) by mouth 2 (two) times daily as needed for muscle spasms. 20 tablet 0   losartan-hydrochlorothiazide (HYZAAR) 100-12.5 MG tablet Take 1 tablet by mouth daily. 90 tablet 0   ondansetron (ZOFRAN) 8 MG tablet Take 1 tablet (8 mg total) by mouth every 8 (eight) hours as needed for nausea or vomiting. Start on the third day after chemotherapy. (Patient not taking: Reported on 11/06/2022) 30 tablet 1   prochlorperazine (COMPAZINE) 10 MG tablet Take 1 tablet (10 mg total) by mouth every 6 (six) hours as needed for nausea or vomiting. (Patient not taking: Reported on 11/06/2022) 30 tablet 1   No current facility-administered medications for this visit.    PHYSICAL EXAMINATION: ECOG PERFORMANCE STATUS: 0 - Asymptomatic  Vitals:   02/09/23 1350  BP: (!) 173/93  Pulse: 82  Resp: 18  Temp: 98.5 F (36.9 C)  SpO2: 100%   Wt Readings from Last 3 Encounters:  02/09/23 169 lb 12.8 oz (77 kg)  01/10/23 166 lb 0.1 oz (75.3 kg)  11/06/22 166 lb (75.3 kg)    GENERAL:alert, no distress and comfortable SKIN: skin color normal, no rashes or significant lesions EYES: normal, Conjunctiva are pink and non-injected,  sclera clear  NEURO: alert & oriented x 3 with fluent speech LABORATORY DATA:  I have reviewed the data as listed    Latest Ref Rng & Units 02/09/2023   12:50 PM 01/11/2023   12:18 AM 09/22/2022    1:35 PM  CBC  WBC 4.0 - 10.5 K/uL 7.6  8.8  7.1   Hemoglobin 12.0 - 15.0 g/dL 16.1  09.6  04.5   Hematocrit 36.0 - 46.0 % 41.2  39.5  38.4   Platelets 150 - 400 K/uL 301  322  328         Latest Ref Rng & Units 02/09/2023   12:50 PM 01/11/2023   12:18 AM 09/22/2022    1:35 PM  CMP  Glucose 70 - 99 mg/dL 92  409  95   BUN 8 - 23 mg/dL 14  14  17    Creatinine 0.44 - 1.00 mg/dL 8.11  9.14  7.82   Sodium 135 - 145 mmol/L 140  139  138   Potassium 3.5 - 5.1 mmol/L 3.8  3.4  3.9   Chloride 98 - 111 mmol/L 102  102  100   CO2 22 - 32 mmol/L 30  26  32   Calcium 8.9 - 10.3 mg/dL 9.8  9.2   9.8   Total Protein 6.5 - 8.1 g/dL 7.9  7.5  7.8   Total Bilirubin 0.3 - 1.2 mg/dL 0.5  0.6  0.4   Alkaline Phos 38 - 126 U/L 79  69  72   AST 15 - 41 U/L 19  26  22    ALT 0 - 44 U/L 17  26  16        RADIOGRAPHIC STUDIES: I have personally reviewed the radiological images as listed and agreed with the findings in the report. No results found.    No orders of the defined types were placed in this encounter.  All questions were answered. The patient knows to call the clinic with any problems, questions or concerns. No barriers to learning was detected. The total time spent in the appointment was 25 minutes.     Malachy Mood, MD 02/09/2023   Carolin Coy, CMA, am acting as scribe for Malachy Mood, MD.   I have reviewed the above documentation for accuracy and completeness, and I agree with the above.

## 2023-02-10 ENCOUNTER — Other Ambulatory Visit: Payer: Self-pay

## 2023-02-12 ENCOUNTER — Telehealth: Payer: Self-pay

## 2023-02-12 NOTE — Telephone Encounter (Signed)
Attempted to contact patient to schedule surveillance colonoscopy in July. Patient's VM was full.

## 2023-02-13 ENCOUNTER — Telehealth: Payer: Self-pay | Admitting: Family Medicine

## 2023-02-13 MED ORDER — EDARBYCLOR 40-25 MG PO TABS: 1.0000 | ORAL_TABLET | Freq: Every day | ORAL | 3 refills | Status: DC

## 2023-02-13 NOTE — Telephone Encounter (Signed)
Jade Alvarez called and states the losartan is making her feel bad, she is having bad water retention and noticed her bp is low. She wants to change back to taking edarbyclor and would like it to be sent to [facility] - [place], [place] - [address]

## 2023-02-19 NOTE — Telephone Encounter (Signed)
Attempted to contact patient a second time. Patient's VM was not set up. I also called patient's work number but was not able to get through.

## 2023-02-21 NOTE — Telephone Encounter (Signed)
Contacted patient and scheduled her for a Colonoscopy July 5 @ 9am. Patient has been scheduled for a previsit 04/13/23 @9am .

## 2023-04-03 ENCOUNTER — Encounter: Payer: Self-pay | Admitting: Hematology

## 2023-04-13 ENCOUNTER — Ambulatory Visit (AMBULATORY_SURGERY_CENTER): Payer: Self-pay

## 2023-04-13 VITALS — Ht 62.0 in | Wt 164.0 lb

## 2023-04-13 DIAGNOSIS — C187 Malignant neoplasm of sigmoid colon: Secondary | ICD-10-CM

## 2023-04-13 MED ORDER — NA SULFATE-K SULFATE-MG SULF 17.5-3.13-1.6 GM/177ML PO SOLN: 1.0000 | Freq: Once | ORAL | 0 refills | Status: AC

## 2023-04-13 NOTE — Progress Notes (Signed)
No egg or soy allergy known to patient  No issues known to pt with past sedation with any surgeries or procedures Patient denies ever being told they had issues or difficulty with intubation  No FH of Malignant Hyperthermia Pt is not on diet pills Pt is not on  home 02  Pt is not on blood thinners  Pt denies issues with constipation  No A fib or A flutter Have any cardiac testing pending--no   Pt instructed to use Singlecare.com or GoodRx for a price reduction on prep   Patient's chart reviewed by Cathlyn [name] CNRA prior to previsit and patient appropriate for the [facility].  Previsit completed and red dot placed by patient's name on their procedure day (on provider's schedule).     During pv pt states no longer has insurance, she is working on getting on her husband's plan by August, pt opted to r/s colonoscopy for August.  Let pt know to pick up prep in early August when insurance begins and she may need to call for new rx if they do not have it anymore, pt verb understanding.

## 2023-04-14 ENCOUNTER — Other Ambulatory Visit: Payer: Self-pay

## 2023-04-20 ENCOUNTER — Encounter: Payer: Self-pay | Admitting: Gastroenterology

## 2023-05-16 ENCOUNTER — Encounter: Payer: Self-pay | Admitting: Hematology

## 2023-05-16 NOTE — Progress Notes (Signed)
This encounter was created in error - please disregard.

## 2023-06-05 ENCOUNTER — Other Ambulatory Visit: Payer: Self-pay

## 2023-06-06 ENCOUNTER — Inpatient Hospital Stay: Payer: Self-pay | Admitting: Hematology

## 2023-06-06 ENCOUNTER — Inpatient Hospital Stay: Payer: Self-pay | Attending: Hematology

## 2023-06-06 ENCOUNTER — Encounter: Payer: Self-pay | Admitting: Hematology

## 2023-06-06 NOTE — Assessment & Plan Note (Deleted)
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 Colonoscopy on 03/03/22 showed partially obstructing tumor in proximal sigmoid colon, path confirmed adenocarcinoma. -s/p resection 04/28/22 -s/p adjuvant chemo CAPOX 05/22/2022-08/24/2022, overall did not tolerate well and required dose reduction  -on cancer surveillance now.  -She is clinically doing well, exam unremarkable, lab reviewed, no clinical concern for recurrence. -She was evaluated in the ED on January 11, 2023 for acute left chest pain, CT chest, abdomen and pelvis with contrast was obtained, which showed no PE or other acute process, no evidence of cancer recurrence.  I reviewed the CT scan images and discussed the findings with her.

## 2023-06-08 ENCOUNTER — Encounter: Payer: Self-pay | Admitting: Gastroenterology

## 2023-06-14 ENCOUNTER — Ambulatory Visit (AMBULATORY_SURGERY_CENTER): Payer: Self-pay | Admitting: *Deleted

## 2023-06-14 VITALS — Ht 62.0 in | Wt 165.0 lb

## 2023-06-14 DIAGNOSIS — C187 Malignant neoplasm of sigmoid colon: Secondary | ICD-10-CM

## 2023-06-14 DIAGNOSIS — Z86010 Personal history of colonic polyps: Secondary | ICD-10-CM

## 2023-06-14 MED ORDER — NA SULFATE-K SULFATE-MG SULF 17.5-3.13-1.6 GM/177ML PO SOLN: 1.0000 | Freq: Once | ORAL | 0 refills | Status: AC

## 2023-06-14 NOTE — Progress Notes (Signed)
Pt's name and DOB verified at the beginning of the pre-visit.  Pt denies any difficulty with ambulating,sitting, laying down or rolling side to side Gave both [facility] main # and MD on call # prior to instructions.  No egg or soy allergy known to patient  No issues known to pt with past sedation with any surgeries or procedures Pt denies having issues being intubated Pt has no issues moving head neck or swallowing No FH of Malignant Hyperthermia Pt is not on diet pills Pt is not on home 02  Pt is not on blood thinners  Pt denies issues with constipation  Pt has frequent issues with constipation RN instructed pt to use Miralax per bottles instructions a week before prep days. Pt states they will Pt is not on dialysis Pt denise any abnormal heart rhythms  Pt denies any upcoming cardiac testing Pt encouraged to use to use Singlecare or Goodrx to reduce cost  Patient's chart reviewed by Jade Alvarez CNRA prior to pre-visit and patient appropriate for the [facility].  Pre-visit completed and red dot placed by patient's name on their procedure day (on provider's schedule).  . Visit by phone165 lb Pt states weight is  Instructed pt why it is important to and  to call if they have any changes in health or new medications. Directed them to the # given and on instructions.   Pt states they will.  Instructions reviewed with pt and pt states understanding. Instructed to review again prior to procedure. Pt states they will.  Instructions sent by mail with coupon and by my chart

## 2023-06-19 ENCOUNTER — Encounter: Payer: Self-pay | Admitting: Hematology

## 2023-06-19 ENCOUNTER — Encounter: Payer: Self-pay | Admitting: Gastroenterology

## 2023-06-22 ENCOUNTER — Telehealth: Payer: Self-pay | Admitting: Gastroenterology

## 2023-06-22 ENCOUNTER — Encounter: Payer: Self-pay | Admitting: Gastroenterology

## 2023-06-22 ENCOUNTER — Ambulatory Visit (AMBULATORY_SURGERY_CENTER): Payer: 59 | Admitting: Gastroenterology

## 2023-06-22 VITALS — BP 118/80 | HR 81 | Temp 96.9°F | Resp 19 | Ht 62.0 in | Wt 162.0 lb

## 2023-06-22 DIAGNOSIS — Z08 Encounter for follow-up examination after completed treatment for malignant neoplasm: Secondary | ICD-10-CM

## 2023-06-22 DIAGNOSIS — D128 Benign neoplasm of rectum: Secondary | ICD-10-CM

## 2023-06-22 DIAGNOSIS — I10 Essential (primary) hypertension: Secondary | ICD-10-CM | POA: Diagnosis not present

## 2023-06-22 DIAGNOSIS — K635 Polyp of colon: Secondary | ICD-10-CM

## 2023-06-22 DIAGNOSIS — E119 Type 2 diabetes mellitus without complications: Secondary | ICD-10-CM | POA: Diagnosis not present

## 2023-06-22 DIAGNOSIS — D123 Benign neoplasm of transverse colon: Secondary | ICD-10-CM | POA: Diagnosis not present

## 2023-06-22 DIAGNOSIS — Z85038 Personal history of other malignant neoplasm of large intestine: Secondary | ICD-10-CM | POA: Diagnosis not present

## 2023-06-22 LAB — HM COLONOSCOPY

## 2023-06-22 MED ORDER — SODIUM CHLORIDE 0.9 % IV SOLN: 500.0000 mL | Freq: Once | INTRAVENOUS | Status: DC

## 2023-06-22 NOTE — Telephone Encounter (Signed)
Pt had nausea and vomiting with prep.  She states that her stools are light brown liquid.  She asked if she should try to drink the rest of miralax prep.  She states that she is no longer nauseous.  Advised her to try to drink the rest of the prep slowly.  She will arrived at 2 for her 3:00 procedure.

## 2023-06-22 NOTE — Progress Notes (Signed)
Gastroenterology History and Physical   Primary Care Physician:  Ronnald Nian, MD   Reason for Procedure:   Colon cancer surveillance   Plan:    Surveillance colonoscopy     HPI: Jade Alvarez is a 62 y.o. female undergoing surveillance colonoscopy.  She underwent a colonoscopy in May 2023 after a positive Cologuard and was found to have a large proximal sigmoid mass and six small tubular adenomas.  Staged as IIIB (T3, N1b), she underwent resection in July 2023 and received adjuvant CAPOX from Aug-Nov 2023.  A CT in March 2024 showed no evidence of recurrent disease.  CEA has been serially monitored and is not elevated.   Past Medical History:  Diagnosis Date   Anemia    PAST HX - NO ISSUES SINCE PARTIAL HYSTERECTOMY   Blood transfusion without reported diagnosis    PRIOR TO HYSTERECTOMY   Colon cancer (HCC) 04/28/2022   Hyperlipidemia    Hypertension    Positive colorectal cancer screening using Cologuard test 06/24/2021    Past Surgical History:  Procedure Laterality Date   ABDOMINAL HYSTERECTOMY     PARTIAL   CESAREAN SECTION     x2   COLONOSCOPY  03/03/2022   FLEXIBLE SIGMOIDOSCOPY N/A 04/28/2022   Procedure: FLEXIBLE SIGMOIDOSCOPY;  Surgeon: Andria Meuse, MD;  Location: [facility];  Service: General;  Laterality: N/A;   LAPAROSCOPIC COLON RESECTION W/ SPLENIC TAKEDOWN  04/28/2022   WISDOM TOOTH EXTRACTION      Prior to Admission medications   Medication Sig Start Date End Date Taking? Authorizing Provider  atorvastatin (LIPITOR) 40 MG tablet Take 1 tablet (40 mg total) by mouth daily. 11/07/22   Ronnald Nian, MD  Azilsartan-Chlorthalidone (EDARBYCLOR) 40-25 MG TABS Take 1 tablet by mouth daily. 02/13/23   Ronnald Nian, MD  Multiple Vitamin (MULTIVITAMIN) capsule Take 1 capsule by mouth daily.    [provider]    Current Outpatient Medications  Medication Sig Dispense Refill   atorvastatin (LIPITOR) 40 MG tablet Take 1 tablet (40  mg total) by mouth daily. 90 tablet 3   Azilsartan-Chlorthalidone (EDARBYCLOR) 40-25 MG TABS Take 1 tablet by mouth daily. 90 tablet 3   Multiple Vitamin (MULTIVITAMIN) capsule Take 1 capsule by mouth daily.     No current facility-administered medications for this visit.    Allergies as of 06/22/2023 - Review Complete 06/14/2023  Allergen Reaction Noted   Penicillins Hives and Shortness Of Breath 12/06/2011    Family History  Problem Relation Age of Onset   Colon cancer Neg Hx    Colon polyps Neg Hx    Esophageal cancer Neg Hx    Rectal cancer Neg Hx    Stomach cancer Neg Hx     Social History   Socioeconomic History   Marital status: Married    Spouse name: Not on file   Number of children: 2   Years of education: Not on file   Highest education level: Not on file  Occupational History   Not on file  Tobacco Use   Smoking status: Never   Smokeless tobacco: Never  Vaping Use   Vaping status: Never Used  Substance and Sexual Activity   Alcohol use: Never   Drug use: Never   Sexual activity: Yes    Birth control/protection: Post-menopausal, Surgical  Other Topics Concern   Not on file  Social History Narrative   Not on file   Social Determinants of Health   Financial Resource Strain: Not  on file  Food Insecurity: Not on file  Transportation Needs: Not on file  Physical Activity: Not on file  Stress: Not on file  Social Connections: Not on file  Intimate Partner Violence: Not on file    Review of Systems:  All other review of systems negative except as mentioned in the HPI.  Physical Exam: Vital signs There were no vitals taken for this visit.  General:   Alert,  Well-developed, well-nourished, pleasant and cooperative in NAD Airway:  Mallampati 2 Lungs:  Clear throughout to auscultation.   Heart:  Regular rate and rhythm; no murmurs, clicks, rubs,  or gallops. Abdomen:  Soft, nontender and nondistended. Normal bowel sounds.   Neuro/Psych:  Normal  mood and affect. A and O x 3   Jennfer Gassen E. Tomasa Rand, MD [facility]

## 2023-06-22 NOTE — Patient Instructions (Addendum)
Thank you for letting us take care of your healthcare needs today. Please see handouts given to you on Polyps and Diverticulosis.    YOU HAD AN ENDOSCOPIC PROCEDURE TODAY AT [facility]:   Refer to the procedure report that was given to you for any specific questions about what was found during the examination.  If the procedure report does not answer your questions, please call your gastroenterologist to clarify.  If you requested that your care partner not be given the details of your procedure findings, then the procedure report has been included in a sealed envelope for you to review at your convenience later.  YOU SHOULD EXPECT: Some feelings of bloating in the abdomen. Passage of more gas than usual.  Walking can help get rid of the air that was put into your GI tract during the procedure and reduce the bloating. If you had a lower endoscopy (such as a colonoscopy or flexible sigmoidoscopy) you may notice spotting of blood in your stool or on the toilet paper. If you underwent a bowel prep for your procedure, you may not have a normal bowel movement for a few days.  Please Note:  You might notice some irritation and congestion in your nose or some drainage.  This is from the oxygen used during your procedure.  There is no need for concern and it should clear up in a day or so.  SYMPTOMS TO REPORT IMMEDIATELY:  Following lower endoscopy (colonoscopy or flexible sigmoidoscopy):  Excessive amounts of blood in the stool  Significant tenderness or worsening of abdominal pains  Swelling of the abdomen that is new, acute  Fever of 100F or higher   For urgent or emergent issues, a gastroenterologist can be reached at any hour by calling [phone]. Do not use MyChart messaging for urgent concerns.    DIET:  We do recommend a small meal at first, but then you may proceed to your regular diet.  Drink plenty of fluids but you should avoid alcoholic beverages for 24  hours.  ACTIVITY:  You should plan to take it easy for the rest of today and you should NOT DRIVE or use heavy machinery until tomorrow (because of the sedation medicines used during the test).    FOLLOW UP: Our staff will call the number listed on your records the next business day following your procedure.  We will call around 7:15- 8:00 am to check on you and address any questions or concerns that you may have regarding the information given to you following your procedure. If we do not reach you, we will leave a message.     If any biopsies were taken you will be contacted by phone or by letter within the next 1-3 weeks.  Please call us at [phone] if you have not heard about the biopsies in 3 weeks.    SIGNATURES/CONFIDENTIALITY: You and/or your care partner have signed paperwork which will be entered into your electronic medical record.  These signatures attest to the fact that that the information above on your After Visit Summary has been reviewed and is understood.  Full responsibility of the confidentiality of this discharge information lies with you and/or your care-partner.

## 2023-06-22 NOTE — Progress Notes (Signed)
Pt's states no medical or surgical changes since previsit or office visit. 

## 2023-06-22 NOTE — Telephone Encounter (Signed)
Inbound call from patient stating she has started vomiting prep. States she was supposed to be finished with prep by 11:00 am this morning for colonoscopy scheduled for today 9/6. Patient requesting a call back to discuss how to proceed. Please advise, thank you.

## 2023-06-22 NOTE — Op Note (Signed)
[facility] Patient Name: Jade Alvarez Procedure Date: 06/22/2023 3:03 PM MRN: [id number] Endoscopist: Lorin Picket E. Tomasa Rand , MD, [id number] Age: 62 Referring MD:  Date of Birth: 11-May-1961 Gender: Female Account #: [id number] Procedure:                Colonoscopy Indications:              High risk colon cancer surveillance: Personal                            history of colon cancer Medicines:                Monitored Anesthesia Care Procedure:                Pre-Anesthesia Assessment:                           - Prior to the procedure, a History and Physical                            was performed, and patient medications and                            allergies were reviewed. The patient's tolerance of                            previous anesthesia was also reviewed. The risks                            and benefits of the procedure and the sedation                            options and risks were discussed with the patient.                            All questions were answered, and informed consent                            was obtained. Prior Anticoagulants: The patient has                            taken no anticoagulant or antiplatelet agents. ASA                            Grade Assessment: II - A patient with mild systemic                            disease. After reviewing the risks and benefits,                            the patient was deemed in satisfactory condition to                            undergo the procedure.  After obtaining informed consent, the colonoscope                            was passed under direct vision. Throughout the                            procedure, the patient's blood pressure, pulse, and                            oxygen saturations were monitored continuously. The                            CF [id number] #[id number] was introduced through the anus                            and advanced to the the cecum,  identified by                            appendiceal orifice and ileocecal valve. The                            colonoscopy was performed without difficulty. The                            patient tolerated the procedure well. The quality                            of the bowel preparation was adequate. The                            ileocecal valve, appendiceal orifice, and rectum                            were photographed. The bowel preparation used was                            Miralax via split dose instruction. Scope In: 3:08:13 PM Scope Out: 3:32:10 PM Scope Withdrawal Time: 0 hours 20 minutes 7 seconds  Total Procedure Duration: 0 hours 23 minutes 57 seconds  Findings:                 The perianal and digital rectal examinations were                            normal. Pertinent negatives include normal                            sphincter tone and no palpable rectal lesions.                           A 2 mm polyp was found in the hepatic flexure. The                            polyp was sessile. The polyp was removed  with a                            cold snare. Resection and retrieval were complete.                            Estimated blood loss was minimal.                           Two sessile polyps were found in the transverse                            colon. The polyps were 1 to 2 mm in size. These                            polyps were removed with a cold snare. Resection                            and retrieval were complete. Estimated blood loss                            was minimal.                           A 3 mm polyp was found in the rectum. The polyp was                            sessile. The polyp was removed with a cold snare.                            Resection and retrieval were complete. Estimated                            blood loss was minimal.                           There was evidence of a prior end-to-end                            colo-colonic  anastomosis at 15 cm proximal to the                            anus. This was patent and was characterized by                            healthy appearing mucosa. A few staples were                            visible.                           A single small-mouthed diverticulum was found in  the descending colon.                           The exam was otherwise normal throughout the                            examined colon.                           The retroflexed view of the distal rectum and anal                            verge was normal and showed no anal or rectal                            abnormalities. Complications:            No immediate complications. Estimated Blood Loss:     Estimated blood loss was minimal. Impression:               - One 2 mm polyp at the hepatic flexure, removed                            with a cold snare. Resected and retrieved.                           - Two 1 to 2 mm polyps in the transverse colon,                            removed with a cold snare. Resected and retrieved.                           - One 3 mm polyp in the rectum, removed with a cold                            snare. Resected and retrieved.                           - Patent end-to-end colo-colonic anastomosis,                            characterized by healthy appearing mucosa and an                            intact staple line.                           - Diverticulosis in the descending colon.                           - The distal rectum and anal verge are normal on                            retroflexion view. Recommendation:           - Patient has a contact number  available for                            emergencies. The signs and symptoms of potential                            delayed complications were discussed with the                            patient. Return to normal activities tomorrow.                            Written discharge  instructions were provided to the                            patient.                           - Resume previous diet.                           - Continue present medications.                           - Await pathology results.                           - Repeat colonoscopy (date not yet determined) for                            surveillance based on pathology results. Takuya Lariccia E. Tomasa Rand, MD 06/22/2023 3:40:37 PM This report has been signed electronically.

## 2023-06-22 NOTE — Progress Notes (Signed)
Called to room to assist during endoscopic procedure.  Patient ID and intended procedure confirmed with present staff. Received instructions for my participation in the procedure from the performing physician.  

## 2023-06-22 NOTE — Progress Notes (Signed)
Sedate, gd SR, tolerated procedure well, VSS, report to RN 

## 2023-06-25 ENCOUNTER — Telehealth: Payer: Self-pay | Admitting: *Deleted

## 2023-06-25 NOTE — Telephone Encounter (Signed)
  Follow up Call-     06/22/2023    2:22 PM 03/03/2022    1:09 PM  Call back number  Post procedure Call Back phone  # [phone]  Permission to leave phone message Yes Yes     Patient questions:  Phone number is a business.  No message left.

## 2023-06-27 LAB — SURGICAL PATHOLOGY

## 2023-07-01 NOTE — Progress Notes (Signed)
Jade Alvarez,   Three of the four polyps that I removed during your recent procedure were completely benign but were proven to be "pre-cancerous" polyps that MAY have grown into cancers if they had not been removed.  Studies shows that at least 20% of women over age 64 and 30% of men over age 70 have pre-cancerous polyps.  Based on current nationally recognized surveillance guidelines, I recommend that you have a repeat colonoscopy in 3 years.   If you develop any new rectal bleeding, abdominal pain or significant bowel habit changes, please contact me before then.

## 2023-07-02 ENCOUNTER — Encounter: Payer: Self-pay | Admitting: Family Medicine

## 2023-12-11 ENCOUNTER — Encounter: Payer: Self-pay | Admitting: Internal Medicine

## 2024-02-05 ENCOUNTER — Other Ambulatory Visit: Payer: Self-pay | Admitting: Family Medicine

## 2024-02-05 NOTE — Telephone Encounter (Signed)
 Called pt no answer, voicemail full, sent mychart message
